# Patient Record
Sex: Female | Born: 1935
Health system: Southern US, Community
[De-identification: ages and names within clinical notes are randomized; demographics above are authoritative.]

## PROBLEM LIST (undated history)

## (undated) DIAGNOSIS — M542 Cervicalgia: Secondary | ICD-10-CM

## (undated) DIAGNOSIS — M5030 Other cervical disc degeneration, unspecified cervical region: Secondary | ICD-10-CM

## (undated) DIAGNOSIS — M51369 Other intervertebral disc degeneration, lumbar region without mention of lumbar back pain or lower extremity pain: Secondary | ICD-10-CM

## (undated) DIAGNOSIS — M5410 Radiculopathy, site unspecified: Secondary | ICD-10-CM

## (undated) DIAGNOSIS — M549 Dorsalgia, unspecified: Secondary | ICD-10-CM

## (undated) DIAGNOSIS — M5136 Other intervertebral disc degeneration, lumbar region: Secondary | ICD-10-CM

## (undated) DIAGNOSIS — E785 Hyperlipidemia, unspecified: Secondary | ICD-10-CM

## (undated) DIAGNOSIS — I219 Acute myocardial infarction, unspecified: Secondary | ICD-10-CM

## (undated) DIAGNOSIS — I2510 Atherosclerotic heart disease of native coronary artery without angina pectoris: Secondary | ICD-10-CM

## (undated) DIAGNOSIS — Z950 Presence of cardiac pacemaker: Secondary | ICD-10-CM

## (undated) DIAGNOSIS — M5430 Sciatica, unspecified side: Secondary | ICD-10-CM

## (undated) DIAGNOSIS — G8929 Other chronic pain: Secondary | ICD-10-CM

## (undated) DIAGNOSIS — I10 Essential (primary) hypertension: Secondary | ICD-10-CM

## (undated) HISTORY — DX: Sciatica, unspecified side: M54.30

## (undated) HISTORY — PX: CATARACT EXTRACTION: SUR2

## (undated) HISTORY — DX: Presence of cardiac pacemaker: Z95.0

---
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
## 1999-02-27 ENCOUNTER — Ambulatory Visit (HOSPITAL_COMMUNITY): Admission: RE | Admit: 1999-02-27 | Discharge: 1999-02-27 | Payer: Self-pay | Admitting: *Deleted

## 2000-01-14 ENCOUNTER — Other Ambulatory Visit: Admission: RE | Admit: 2000-01-14 | Discharge: 2000-01-14 | Payer: Self-pay | Admitting: Gastroenterology

## 2002-01-26 ENCOUNTER — Other Ambulatory Visit: Admission: RE | Admit: 2002-01-26 | Discharge: 2002-01-26 | Payer: Self-pay | Admitting: Internal Medicine

## 2002-03-04 ENCOUNTER — Encounter: Payer: Self-pay | Admitting: Internal Medicine

## 2002-03-04 ENCOUNTER — Encounter: Admission: RE | Admit: 2002-03-04 | Discharge: 2002-03-04 | Payer: Self-pay | Admitting: Internal Medicine

## 2002-07-15 HISTORY — PX: BACK SURGERY: SHX140

## 2003-06-24 ENCOUNTER — Encounter: Admission: RE | Admit: 2003-06-24 | Discharge: 2003-06-24 | Payer: Self-pay | Admitting: Internal Medicine

## 2003-07-06 ENCOUNTER — Inpatient Hospital Stay (HOSPITAL_COMMUNITY): Admission: RE | Admit: 2003-07-06 | Discharge: 2003-07-12 | Payer: Self-pay | Admitting: Neurosurgery

## 2004-11-19 ENCOUNTER — Emergency Department (HOSPITAL_COMMUNITY): Admission: EM | Admit: 2004-11-19 | Discharge: 2004-11-19 | Payer: Self-pay | Admitting: Emergency Medicine

## 2006-09-01 ENCOUNTER — Encounter: Admission: RE | Admit: 2006-09-01 | Discharge: 2006-09-01 | Payer: Self-pay | Admitting: Internal Medicine

## 2006-10-06 ENCOUNTER — Emergency Department (HOSPITAL_COMMUNITY): Admission: EM | Admit: 2006-10-06 | Discharge: 2006-10-06 | Payer: Self-pay | Admitting: Emergency Medicine

## 2007-03-10 ENCOUNTER — Encounter: Admission: RE | Admit: 2007-03-10 | Discharge: 2007-03-10 | Payer: Self-pay | Admitting: Internal Medicine

## 2007-03-15 ENCOUNTER — Encounter: Admission: RE | Admit: 2007-03-15 | Discharge: 2007-03-15 | Payer: Self-pay | Admitting: Internal Medicine

## 2007-06-01 ENCOUNTER — Encounter: Admission: RE | Admit: 2007-06-01 | Discharge: 2007-08-07 | Payer: Self-pay | Admitting: Internal Medicine

## 2007-09-04 ENCOUNTER — Encounter: Admission: RE | Admit: 2007-09-04 | Discharge: 2007-09-04 | Payer: Self-pay | Admitting: Internal Medicine

## 2008-09-05 ENCOUNTER — Encounter: Admission: RE | Admit: 2008-09-05 | Discharge: 2008-09-05 | Payer: Self-pay | Admitting: Internal Medicine

## 2009-03-04 ENCOUNTER — Emergency Department (HOSPITAL_COMMUNITY): Admission: EM | Admit: 2009-03-04 | Discharge: 2009-03-05 | Payer: Self-pay | Admitting: Emergency Medicine

## 2009-05-15 ENCOUNTER — Emergency Department (HOSPITAL_COMMUNITY): Admission: EM | Admit: 2009-05-15 | Discharge: 2009-05-15 | Payer: Self-pay | Admitting: Emergency Medicine

## 2009-07-15 ENCOUNTER — Ambulatory Visit: Payer: Self-pay | Admitting: Internal Medicine

## 2009-07-15 ENCOUNTER — Encounter: Payer: Self-pay | Admitting: Emergency Medicine

## 2009-07-15 ENCOUNTER — Inpatient Hospital Stay (HOSPITAL_COMMUNITY): Admission: EM | Admit: 2009-07-15 | Discharge: 2009-07-18 | Payer: Self-pay | Admitting: Cardiovascular Disease

## 2009-07-16 ENCOUNTER — Encounter (INDEPENDENT_AMBULATORY_CARE_PROVIDER_SITE_OTHER): Payer: Self-pay | Admitting: Cardiovascular Disease

## 2009-07-17 DIAGNOSIS — Z950 Presence of cardiac pacemaker: Secondary | ICD-10-CM

## 2009-07-17 HISTORY — DX: Presence of cardiac pacemaker: Z95.0

## 2009-07-17 HISTORY — PX: PACEMAKER INSERTION: SHX728

## 2009-08-23 ENCOUNTER — Ambulatory Visit (HOSPITAL_COMMUNITY): Admission: RE | Admit: 2009-08-23 | Discharge: 2009-08-23 | Payer: Self-pay | Admitting: Internal Medicine

## 2009-08-31 ENCOUNTER — Encounter: Admission: RE | Admit: 2009-08-31 | Discharge: 2009-08-31 | Payer: Self-pay | Admitting: Internal Medicine

## 2009-09-06 HISTORY — PX: NM MYOCAR PERF WALL MOTION: HXRAD629

## 2009-09-07 ENCOUNTER — Ambulatory Visit (HOSPITAL_COMMUNITY): Admission: RE | Admit: 2009-09-07 | Discharge: 2009-09-07 | Payer: Self-pay | Admitting: Internal Medicine

## 2009-10-26 ENCOUNTER — Encounter (INDEPENDENT_AMBULATORY_CARE_PROVIDER_SITE_OTHER): Payer: Self-pay | Admitting: *Deleted

## 2009-10-30 ENCOUNTER — Telehealth (INDEPENDENT_AMBULATORY_CARE_PROVIDER_SITE_OTHER): Payer: Self-pay | Admitting: *Deleted

## 2009-11-08 ENCOUNTER — Ambulatory Visit (HOSPITAL_COMMUNITY): Admission: RE | Admit: 2009-11-08 | Discharge: 2009-11-08 | Payer: Self-pay | Admitting: Endocrinology

## 2010-05-30 ENCOUNTER — Encounter: Admission: RE | Admit: 2010-05-30 | Discharge: 2010-05-30 | Payer: Self-pay | Admitting: Internal Medicine

## 2010-08-05 ENCOUNTER — Encounter: Payer: Self-pay | Admitting: Internal Medicine

## 2010-08-14 NOTE — Letter (Signed)
Summary: Device-Delinquent Check  [facility], Main Office  [address]   [place], [place] [address]   Phone: [phone]  Fax: [phone]     October 26, 2009 MRN: [id number]   [facility] [place] [address] [place], [place]  [address]   Dear Ms. Jezek,  According to our records, you have not had your implanted device checked in the recommended period of time.  We are unable to determine appropriate device function without checking your device on a regular basis.  Please call our office to schedule an appointment as soon as possible.  If you are having your device checked by another physician, please call us so that we may update our records.  Thank you, Altha Harm, LPN  October 26, 2009 3:57 PM   [facility]

## 2010-08-14 NOTE — Progress Notes (Signed)
Summary: letter / calling back   Phone Note Call from Patient Call back at Home Phone [phone]   Caller: Patient Reason for Call: Talk to Nurse Summary of Call: pt received letter, not sure whats going on, request call back Initial call taken by: Migdalia Dk,  October 30, 2009 8:59 AM  Follow-up for Phone Call        per pt calling back regarding letter. [phone] Lorne Skeens  October 30, 2009 12:26 PM   Additional Follow-up for Phone Call Additional follow up Details #1::        The patient's device is being followed by [facility], Dr. Tresa Endo. Additional Follow-up by: Altha Harm, LPN,  October 31, 2009 8:31 AM

## 2010-08-15 ENCOUNTER — Other Ambulatory Visit: Payer: Self-pay | Admitting: Internal Medicine

## 2010-08-15 DIAGNOSIS — Z1239 Encounter for other screening for malignant neoplasm of breast: Secondary | ICD-10-CM

## 2010-09-04 ENCOUNTER — Ambulatory Visit
Admission: RE | Admit: 2010-09-04 | Discharge: 2010-09-04 | Disposition: A | Discharge status: Home / Self Care | Payer: BC Managed Care – PPO | Source: Ambulatory Visit | Attending: Internal Medicine | Admitting: Internal Medicine

## 2010-09-04 DIAGNOSIS — Z1239 Encounter for other screening for malignant neoplasm of breast: Secondary | ICD-10-CM

## 2010-09-23 ENCOUNTER — Emergency Department (HOSPITAL_COMMUNITY)
Admission: EM | Admit: 2010-09-23 | Discharge: 2010-09-23 | Disposition: A | Discharge status: Home / Self Care | Payer: Medicare Other | Attending: Emergency Medicine | Admitting: Emergency Medicine

## 2010-09-23 DIAGNOSIS — R109 Unspecified abdominal pain: Secondary | ICD-10-CM | POA: Insufficient documentation

## 2010-09-23 DIAGNOSIS — K5289 Other specified noninfective gastroenteritis and colitis: Secondary | ICD-10-CM | POA: Insufficient documentation

## 2010-09-23 DIAGNOSIS — E789 Disorder of lipoprotein metabolism, unspecified: Secondary | ICD-10-CM | POA: Insufficient documentation

## 2010-09-23 DIAGNOSIS — Z950 Presence of cardiac pacemaker: Secondary | ICD-10-CM | POA: Insufficient documentation

## 2010-09-23 DIAGNOSIS — R112 Nausea with vomiting, unspecified: Secondary | ICD-10-CM | POA: Insufficient documentation

## 2010-09-23 DIAGNOSIS — Z79899 Other long term (current) drug therapy: Secondary | ICD-10-CM | POA: Insufficient documentation

## 2010-09-23 DIAGNOSIS — I10 Essential (primary) hypertension: Secondary | ICD-10-CM | POA: Insufficient documentation

## 2010-09-23 DIAGNOSIS — R197 Diarrhea, unspecified: Secondary | ICD-10-CM | POA: Insufficient documentation

## 2010-09-23 LAB — BASIC METABOLIC PANEL
BUN: 20 mg/dL (ref 6–23)
CO2: 29 mEq/L (ref 19–32)
Calcium: 9.7 mg/dL (ref 8.4–10.5)
Chloride: 104 mEq/L (ref 96–112)
Creatinine, Ser: 0.92 mg/dL (ref 0.4–1.2)
GFR calc Af Amer: >60 mL/min (ref >60)
GFR calc non Af Amer: 58 mL/min — ABNORMAL LOW (ref >60)
Glucose, Bld: 115 mg/dL — ABNORMAL HIGH (ref 70–99)
Potassium: 4 mEq/L (ref 3.5–5.1)
Sodium: 143 mEq/L (ref 135–145)

## 2010-09-23 LAB — DIFFERENTIAL
Basophils Absolute: 0 10*3/uL (ref 0.0–0.1)
Basophils Relative: 0 % (ref 0–1)
Eosinophils Absolute: 0.1 10*3/uL (ref 0.0–0.7)
Eosinophils Relative: 0 % (ref 0–5)
Lymphocytes Relative: 6 % — ABNORMAL LOW (ref 12–46)
Lymphs Abs: 0.7 10*3/uL (ref 0.7–4.0)
Monocytes Absolute: 0.6 10*3/uL (ref 0.1–1.0)
Monocytes Relative: 5 % (ref 3–12)
Neutro Abs: 10.3 10*3/uL — ABNORMAL HIGH (ref 1.7–7.7)
Neutrophils Relative %: 88 % — ABNORMAL HIGH (ref 43–77)

## 2010-09-23 LAB — CBC
HCT: 43.6 % (ref 36.0–46.0)
Hemoglobin: 14.9 g/dL (ref 12.0–15.0)
MCH: 31.6 pg (ref 26.0–34.0)
MCHC: 34.2 g/dL (ref 30.0–36.0)
MCV: 92.4 fL (ref 78.0–100.0)
Platelets: 224 10*3/uL (ref 150–400)
RBC: 4.72 MIL/uL (ref 3.87–5.11)
RDW: 13.7 % (ref 11.5–15.5)
WBC: 11.7 10*3/uL — ABNORMAL HIGH (ref 4.0–10.5)

## 2010-09-30 LAB — CBC
HCT: 43.9 % (ref 36.0–46.0)
Hemoglobin: 13.4 g/dL (ref 12.0–15.0)
Hemoglobin: 14.5 g/dL (ref 12.0–15.0)
MCHC: 33.9 g/dL (ref 30.0–36.0)
MCHC: 34.3 g/dL (ref 30.0–36.0)
MCV: 92.4 fL (ref 78.0–100.0)
RBC: 4.22 MIL/uL (ref 3.87–5.11)
RBC: 4.78 MIL/uL (ref 3.87–5.11)
RDW: 13.4 % (ref 11.5–15.5)
RDW: 13.6 % (ref 11.5–15.5)
WBC: 5.5 10*3/uL (ref 4.0–10.5)

## 2010-09-30 LAB — POCT I-STAT, CHEM 8
BUN: 13 mg/dL (ref 6–23)
Calcium, Ion: 1.17 mmol/L (ref 1.12–1.32)
Chloride: 105 mEq/L (ref 96–112)
Creatinine, Ser: 0.9 mg/dL (ref 0.4–1.2)
Glucose, Bld: 134 mg/dL — ABNORMAL HIGH (ref 70–99)
TCO2: 28 mmol/L (ref 0–100)

## 2010-09-30 LAB — CK TOTAL AND CKMB (NOT AT ARMC)
CK, MB: 2 ng/mL (ref 0.3–4.0)
Relative Index: INVALID (ref 0.0–2.5)
Relative Index: INVALID (ref 0.0–2.5)
Total CK: 56 U/L (ref 7–177)
Total CK: 61 U/L (ref 7–177)
Total CK: 79 U/L (ref 7–177)

## 2010-09-30 LAB — BASIC METABOLIC PANEL
BUN: 6 mg/dL (ref 6–23)
CO2: 22 mEq/L (ref 19–32)
CO2: 24 mEq/L (ref 19–32)
Calcium: 8.7 mg/dL (ref 8.4–10.5)
Calcium: 9.1 mg/dL (ref 8.4–10.5)
Chloride: 114 mEq/L — ABNORMAL HIGH (ref 96–112)
Creatinine, Ser: 0.64 mg/dL (ref 0.4–1.2)
GFR calc Af Amer: >60 mL/min (ref >60)
Glucose, Bld: 96 mg/dL (ref 70–99)
Sodium: 144 mEq/L (ref 135–145)

## 2010-09-30 LAB — DIFFERENTIAL
Basophils Absolute: 0 10*3/uL (ref 0.0–0.1)
Eosinophils Relative: 1 % (ref 0–5)
Lymphocytes Relative: 20 % (ref 12–46)
Lymphs Abs: 1.5 10*3/uL (ref 0.7–4.0)
Monocytes Absolute: 0.7 10*3/uL (ref 0.1–1.0)
Monocytes Relative: 10 % (ref 3–12)
Neutro Abs: 4.9 10*3/uL (ref 1.7–7.7)

## 2010-09-30 LAB — HEPATIC FUNCTION PANEL
Albumin: 3.4 g/dL — ABNORMAL LOW (ref 3.5–5.2)
Alkaline Phosphatase: 53 U/L (ref 39–117)
Total Protein: 6.9 g/dL (ref 6.0–8.3)

## 2010-09-30 LAB — URINALYSIS, ROUTINE W REFLEX MICROSCOPIC
Bilirubin Urine: NEGATIVE
Glucose, UA: NEGATIVE mg/dL
Hgb urine dipstick: NEGATIVE
Ketones, ur: NEGATIVE mg/dL
Nitrite: NEGATIVE
pH: 7.5 (ref 5.0–8.0)

## 2010-09-30 LAB — MAGNESIUM: Magnesium: 2.2 mg/dL (ref 1.5–2.5)

## 2010-09-30 LAB — BRAIN NATRIURETIC PEPTIDE: Pro B Natriuretic peptide (BNP): 398 pg/mL — ABNORMAL HIGH (ref 0.0–100.0)

## 2010-09-30 LAB — T3 UPTAKE: T3 Uptake Ratio: 39.6 % — ABNORMAL HIGH (ref 22.5–37.0)

## 2010-09-30 LAB — TROPONIN I: Troponin I: 0.02 ng/mL (ref 0.00–0.06)

## 2010-09-30 LAB — URINE CULTURE
Colony Count: NO GROWTH
Culture: NO GROWTH

## 2010-09-30 LAB — POCT CARDIAC MARKERS
CKMB, poc: 1.3 ng/mL (ref 1.0–8.0)
Myoglobin, poc: 126 ng/mL (ref 12–200)

## 2010-09-30 LAB — PROTIME-INR: INR: 1.18 (ref 0.00–1.49)

## 2010-09-30 LAB — MRSA PCR SCREENING: MRSA by PCR: NEGATIVE

## 2010-09-30 LAB — APTT: aPTT: 33 seconds (ref 24–37)

## 2010-11-30 NOTE — Op Note (Signed)
Krista Ray, Krista Ray                       ACCOUNT NO.:  [id number]   MEDICAL RECORD NO.:  [id number]                   PATIENT TYPE:  INP   LOCATION:  3172                                 FACILITY:  [facility]   PHYSICIAN:  Hewitt Shorts, M.D.            DATE OF BIRTH:  08/03/1923   DATE OF PROCEDURE:  07/06/2003  DATE OF DISCHARGE:                                 OPERATIVE REPORT   PREOPERATIVE DIAGNOSIS:  Right L5-S1 lumbar disk herniation.   POSTOPERATIVE DIAGNOSIS:  Right L5-S1 lumbar disk herniation.   OPERATION PERFORMED:  Right L5 hemilaminectomy and microdiskectomy with  microdissection.   SURGEON:  Hewitt Shorts, M.D.   ASSISTANT:  Hilda Lias, M.D.   ANESTHESIA:  General endotracheal.   INDICATIONS FOR PROCEDURE:  The patient is a 77 year old woman who presented  with a right lumbar radiculopathy and was found to have severe stenosis at  L3-4, marked stenosis at L4-5 but a notable L5-S1 lumbar disk herniation.  Decision was made to proceed with decompression and diskectomy.   DESCRIPTION OF PROCEDURE:  The patient was brought to the operating room and  placed under general endotracheal anesthesia.  The patient was turned to a  prone position. The lumbar region was prepped with DuraPrep and draped in a  sterile fashion.  The midline was infiltrated with local anesthetic with  epinephrine.  An x-ray was taken and the L5-S1 level was identified.  The  midline incision was made, carried down through the subcutaneous tissue with  bipolar cautery and electrocautery was used to maintain hemostasis.  Dissection was carried down to the lumbar fascia which was incised on the  right side of the midline and the paraspinal muscles were dissected from the  spinous processes and lamina in subperiosteal fashion.  X-ray was taken and  the L5-S1 interlaminar space was identified.  We initially proceeded with a  right L5-S1 laminotomy but because of the stenosis, we  extended the  laminotomy to a right L5 hemilaminectomy.  The ligamentum flavum was  carefully resected.  The dura was quite thin and a rent in the dura did  occur as decompression was performed.  However, this was repaired with a  number of interrupted 6-0 Prolene sutures.  We then were able to gradually  mobilize the right S1 nerve root along with the thecal sac medially.  The  disk herniation which was a free fragment was partly adherent to the dura.  We were able to free that up and remove the free fragment and then enter  into the disk space and proceed with diskectomy removing loose degenerated  disk material.  In the end, all fragments of disk material were removed from  both the disk space and the epidural space and good decompression of the  thecal sac and nerve root was achieved.  In the area where the dural rent  had been repaired, a thin layer of muscle tissue was  draped over this repair  and then bioglue was injected over that.  The wound was then closed in  multiple layers.  The deep fascia closed with interrupted undyed 1 Vicryl  suture, the subcutaneous and subcuticular layer closed with interrupted  inverted 2-0 and 3-0 undyed Vicryl sutures, the skin edges were approximated  with Dermabond.  The patient tolerated the procedure  well.  The estimated blood loss was 100 mL.  Sponge and instrument counts  were correct.  Following surgery, the patient was turned back to supine  position to be reversed from anesthetic, extubated and transferred to  recovery room for further care.                                               Hewitt Shorts, M.D.    RWN/MEDQ  D:  07/06/2003  T:  07/07/2003  Job:  [id number]

## 2010-11-30 NOTE — H&P (Signed)
Krista Ray, Krista Ray                       ACCOUNT NO.:  [id number]   MEDICAL RECORD NO.:  [id number]                   PATIENT TYPE:  INP   LOCATION:  3009                                 FACILITY:  [facility]   PHYSICIAN:  Hewitt Shorts, M.D.            DATE OF BIRTH:  08/03/1923   DATE OF ADMISSION:  07/06/2003  DATE OF DISCHARGE:                                HISTORY & PHYSICAL   HISTORY OF PRESENT ILLNESS:  The patient is a 77 year old right-handed black  female who is evaluated for an acute right lumbar radiculopathy.  Her  symptoms began about two months ago without any particular cause.  She  typically will take a walk every morning, but about two months ago, without  any particular cause, she developed pain in the right buttocks that  extending down to the posterior aspect of the right thigh and leg, and  inferolateral aspect of the right foot.  She has had numbness in the right  small toe.  She denies any low back pain, weakness or paresthesias.  She was  treated with Medrol Dosepak without relief.  An MRI was obtained for further  evaluation.  This study showed evidence of multilevel degenerative joint  disease with spondylosis, grade I degenerative spondylolisthesis, facet  arthropathy at both the L2-L3 and L3-L4.  There is moderate multifactorial  spinal stenosis at both the L3-L4 and L4-L5, but the most significant  finding was her right L5-S1 lumbar disk herniation.  The patient is admitted  now for treatment of such.   PAST MEDICAL HISTORY:  She has a history of hypertension treated with  hydrochlorothiazide.  No history of myocardial infarction, history of  diabetes, peptic ulcer disease or lung disease.   ALLERGIES:  PENICILLIN causes hives.   CURRENT MEDICATIONS:  1. Zoloft 50 mg q.a.m.  2. Hydrochlorothiazide 25 mg q.a.m.   PREVIOUS SURGERIES:  Hysterectomy, appendectomy and right-eye cataract  surgery.   FAMILY HISTORY:  Her parents have passed  on.   SOCIAL HISTORY:  The patient is widowed.  She has a son who lives in  [place] area. She does not smoke or drink alcoholic beverages or have a  history of substance abuse.   REVIEW OF SYSTEMS:  Notable for those described in her history of present  illness and past medical history, otherwise unremarkable.   PHYSICAL EXAMINATION:  GENERAL:  The patient is a well-developed, well-  nourished black female in obvious discomfort.  She is tilted so as to avoid  bearing any pressure on the right buttocks and thigh.  She uses a cane to  give her some support as she walks.  VITAL SIGNS:  Height 5 feet, 1 inch.  Weight is 145 pounds.  She is  afebrile.  LUNGS:  Clear to auscultation.  She has symmetrical respiratory excursion.  HEART:  Regular rate and rhythm, S1 and S2.  There is no murmur.  ABDOMEN:  Soft, nondistended.  Bowel sounds are present.  EXTREMITIES:  Examination shows no clubbing, cyanosis or edema.  MUSCULOSKELETAL:  Examination showed no tenderness to palpation over the  lumbar spinous process, or primary musculature.  However, the mobility of  her lumbar region is limited in flexion and extension due to pain, and she  has positive straight leg raising on the right, negative straight leg  raising on the left.  NEUROLOGIC:  Examination shows 5/5 strength in the dorsiflexion, plantar  flexion and extensor hallucis longus bilaterally.  Sensation is diminished  to pinprick in the lateral aspect of the right foot.  Reflexes are 1 and 2  at the quadriceps and left gastrocnemius.  However, the right gastrocnemius  is absent.  Toes are downgoing bilaterally.  Her gait and stance both favor  the right lower extremity.   IMPRESSION:  Acute right S1 radiculopathy secondary to a right L5-S1 lumbar  disk herniation.  The patient has degenerative disease with spondylosis,  with moderate stenosis at both L3-L4 and L4-L5 and degenerative listhesis at  both L2-L3 and L3-L4.   PLAN:   The patient will be admitted for right L5-S1 lumbar microdiscectomy.  We discussed the nature of the surgery, using MRI scan along with a myelo  __________ .  Also discussed the surgery, hospitalization for recuperation,  her limitations postoperatively and risk of infection, bleeding, possible  need for transfusion, the risk of nerve dysfunction, pain, weakness,  numbness or paresthesia, the risk of dural tear and CSF leaks, possible need  for further surgery, risk of recurrent disk herniation, possible need for  further surgery and anesthetic risks,  myocardial infarction, stroke,  culminating in death.  I discussed all of this.  She does wish to go ahead  for surgery and is admitted for such.                                                Hewitt Shorts, M.D.    RWN/MEDQ  D:  07/08/2003  T:  07/10/2003  Job:  [id number]

## 2010-11-30 NOTE — Discharge Summary (Signed)
Krista Ray, Krista Ray                       ACCOUNT NO.:  [id number]   MEDICAL RECORD NO.:  [id number]                   PATIENT TYPE:  INP   LOCATION:  3009                                 FACILITY:  [facility]   PHYSICIAN:  Hewitt Shorts, M.D.            DATE OF BIRTH:  08/03/1923   DATE OF ADMISSION:  07/06/2003  DATE OF DISCHARGE:  07/12/2003                                 DISCHARGE SUMMARY   HISTORY:  This is a 77 year old woman who presented with a right lumbar  radiculopathy.  MRI showed multilevel degenerative changes with moderate  stenosis at the L3,4 and L4,5 levels, but the most significant finding was a  right L5/S1 dyskinesia.  The patient was admitted for diskectomy.   GENERAL EXAMINATION:  Unremarkable.   NEUROLOGIC EXAMINATION:  Showed 5/5 strength.   HOSPITAL COURSE:  The patient was admitted, underwent a right L5  hemilaminectomy and microdiskectomy with microdissection.  At the time of  surgery, the stenosis was found to be quite marked and the dura thinned and  a rent in the dura occurred as the decompression was performed.  This was  repaired with Prolene sutures.  Following surgery, the patient was kept on  bed rest for a total of two days and then gradually mobilized.  She did  fairly well initially but had some unsteadiness and lightheadedness when  ambulating.  This has gradually improved.  She has also had some discomfort  in the right hip and this has also gradually improved.   At this time, the patient is being discharged to home.  She is going to be  assisted in her care by her niece.  She has been given a prescription for  Vicodin 1 tablet q.i.d. p.r.n. pain, 20 tablets prescribed.  No refills.   DISCHARGE DIAGNOSES:  1. Lumbar dyskinesia.  2. Lumbar stenosis.  3. Lumbar spondylosis.  4. Lumbar degenerative disk disease.   DISCHARGE FOLLOWUP:  She is to return to my office in three weeks for  followup.  No x-rays are necessary.                                           Hewitt Shorts, M.D.    RWN/MEDQ  D:  07/11/2003  T:  07/11/2003  Job:  [id number]

## 2011-05-21 ENCOUNTER — Other Ambulatory Visit: Payer: Self-pay

## 2011-05-21 ENCOUNTER — Encounter: Payer: Self-pay | Admitting: *Deleted

## 2011-05-21 ENCOUNTER — Emergency Department (HOSPITAL_COMMUNITY): Payer: Medicare Other

## 2011-05-21 ENCOUNTER — Emergency Department (HOSPITAL_COMMUNITY)
Admission: EM | Admit: 2011-05-21 | Discharge: 2011-05-21 | Disposition: A | Discharge status: Home / Self Care | Payer: Medicare Other | Attending: Emergency Medicine | Admitting: Emergency Medicine

## 2011-05-21 DIAGNOSIS — Z45018 Encounter for adjustment and management of other part of cardiac pacemaker: Secondary | ICD-10-CM

## 2011-05-21 DIAGNOSIS — M25519 Pain in unspecified shoulder: Secondary | ICD-10-CM | POA: Insufficient documentation

## 2011-05-21 DIAGNOSIS — M546 Pain in thoracic spine: Secondary | ICD-10-CM | POA: Insufficient documentation

## 2011-05-21 DIAGNOSIS — R079 Chest pain, unspecified: Secondary | ICD-10-CM | POA: Insufficient documentation

## 2011-05-21 DIAGNOSIS — I252 Old myocardial infarction: Secondary | ICD-10-CM | POA: Insufficient documentation

## 2011-05-21 DIAGNOSIS — I2510 Atherosclerotic heart disease of native coronary artery without angina pectoris: Secondary | ICD-10-CM | POA: Insufficient documentation

## 2011-05-21 DIAGNOSIS — Z79899 Other long term (current) drug therapy: Secondary | ICD-10-CM | POA: Insufficient documentation

## 2011-05-21 DIAGNOSIS — I10 Essential (primary) hypertension: Secondary | ICD-10-CM | POA: Insufficient documentation

## 2011-05-21 DIAGNOSIS — Z950 Presence of cardiac pacemaker: Secondary | ICD-10-CM | POA: Insufficient documentation

## 2011-05-21 DIAGNOSIS — M79609 Pain in unspecified limb: Secondary | ICD-10-CM | POA: Insufficient documentation

## 2011-05-21 HISTORY — DX: Essential (primary) hypertension: I10

## 2011-05-21 HISTORY — DX: Atherosclerotic heart disease of native coronary artery without angina pectoris: I25.10

## 2011-05-21 HISTORY — DX: Acute myocardial infarction, unspecified: I21.9
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
## 2011-05-21 NOTE — ED Notes (Signed)
[employer] rep states will be here in about to check pacemaker.

## 2011-05-21 NOTE — ED Notes (Signed)
Pt is in X-ray will do ECG when she returns
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
## 2011-05-21 NOTE — ED Notes (Signed)
"  OK to remove from monitor" per Dr. Manus Gunning, pt and family frustrated about wait.  plan, process & wait explained, interrogator here at [facility], arrived now. Alert, NAD, calm.
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
## 2011-07-29 ENCOUNTER — Other Ambulatory Visit: Payer: Self-pay | Admitting: Internal Medicine

## 2011-07-29 DIAGNOSIS — Z1231 Encounter for screening mammogram for malignant neoplasm of breast: Secondary | ICD-10-CM

## 2011-09-06 ENCOUNTER — Ambulatory Visit: Payer: Medicare Other

## 2011-09-17 ENCOUNTER — Ambulatory Visit
Admission: RE | Admit: 2011-09-17 | Discharge: 2011-09-17 | Disposition: A | Discharge status: Home / Self Care | Payer: Medicare Other | Source: Ambulatory Visit | Attending: Internal Medicine | Admitting: Internal Medicine

## 2011-09-17 DIAGNOSIS — Z1231 Encounter for screening mammogram for malignant neoplasm of breast: Secondary | ICD-10-CM | POA: Diagnosis not present

## 2011-10-17 DIAGNOSIS — I10 Essential (primary) hypertension: Secondary | ICD-10-CM | POA: Diagnosis not present

## 2011-10-17 DIAGNOSIS — E782 Mixed hyperlipidemia: Secondary | ICD-10-CM | POA: Diagnosis not present

## 2011-10-24 DIAGNOSIS — M159 Polyosteoarthritis, unspecified: Secondary | ICD-10-CM | POA: Diagnosis not present

## 2011-10-24 DIAGNOSIS — E782 Mixed hyperlipidemia: Secondary | ICD-10-CM | POA: Diagnosis not present

## 2011-10-24 DIAGNOSIS — I10 Essential (primary) hypertension: Secondary | ICD-10-CM | POA: Diagnosis not present

## 2011-10-24 DIAGNOSIS — K5730 Diverticulosis of large intestine without perforation or abscess without bleeding: Secondary | ICD-10-CM | POA: Diagnosis not present

## 2011-11-12 DIAGNOSIS — I442 Atrioventricular block, complete: Secondary | ICD-10-CM | POA: Diagnosis not present

## 2011-11-12 DIAGNOSIS — Z45018 Encounter for adjustment and management of other part of cardiac pacemaker: Secondary | ICD-10-CM | POA: Diagnosis not present

## 2011-11-18 DIAGNOSIS — E782 Mixed hyperlipidemia: Secondary | ICD-10-CM | POA: Diagnosis not present

## 2011-11-18 DIAGNOSIS — R000 Tachycardia, unspecified: Secondary | ICD-10-CM | POA: Diagnosis not present

## 2011-11-18 DIAGNOSIS — I119 Hypertensive heart disease without heart failure: Secondary | ICD-10-CM | POA: Diagnosis not present

## 2011-11-22 DIAGNOSIS — E0520 Thyrotoxicosis with toxic multinodular goiter without thyrotoxic crisis or storm: Secondary | ICD-10-CM | POA: Diagnosis not present

## 2011-11-28 DIAGNOSIS — R000 Tachycardia, unspecified: Secondary | ICD-10-CM | POA: Diagnosis not present

## 2011-11-28 DIAGNOSIS — E782 Mixed hyperlipidemia: Secondary | ICD-10-CM | POA: Diagnosis not present

## 2011-11-28 DIAGNOSIS — I119 Hypertensive heart disease without heart failure: Secondary | ICD-10-CM | POA: Diagnosis not present

## 2011-11-28 HISTORY — PX: US ECHOCARDIOGRAPHY: HXRAD669

## 2011-12-06 DIAGNOSIS — H04129 Dry eye syndrome of unspecified lacrimal gland: Secondary | ICD-10-CM | POA: Diagnosis not present

## 2011-12-06 DIAGNOSIS — H409 Unspecified glaucoma: Secondary | ICD-10-CM | POA: Diagnosis not present

## 2011-12-06 DIAGNOSIS — H4011X0 Primary open-angle glaucoma, stage unspecified: Secondary | ICD-10-CM | POA: Diagnosis not present

## 2011-12-24 ENCOUNTER — Other Ambulatory Visit: Payer: Self-pay | Admitting: Internal Medicine

## 2011-12-24 DIAGNOSIS — M79609 Pain in unspecified limb: Secondary | ICD-10-CM | POA: Diagnosis not present

## 2011-12-24 DIAGNOSIS — M7989 Other specified soft tissue disorders: Secondary | ICD-10-CM

## 2011-12-24 DIAGNOSIS — I10 Essential (primary) hypertension: Secondary | ICD-10-CM | POA: Diagnosis not present

## 2011-12-24 DIAGNOSIS — R609 Edema, unspecified: Secondary | ICD-10-CM | POA: Diagnosis not present

## 2011-12-24 DIAGNOSIS — E782 Mixed hyperlipidemia: Secondary | ICD-10-CM | POA: Diagnosis not present

## 2011-12-25 ENCOUNTER — Inpatient Hospital Stay: Admission: RE | Admit: 2011-12-25 | Payer: Medicare Other | Source: Ambulatory Visit

## 2012-01-01 DIAGNOSIS — R209 Unspecified disturbances of skin sensation: Secondary | ICD-10-CM | POA: Diagnosis not present

## 2012-01-14 ENCOUNTER — Other Ambulatory Visit: Payer: Self-pay | Admitting: Internal Medicine

## 2012-01-14 DIAGNOSIS — R600 Localized edema: Secondary | ICD-10-CM

## 2012-01-14 DIAGNOSIS — R609 Edema, unspecified: Secondary | ICD-10-CM | POA: Diagnosis not present

## 2012-01-14 DIAGNOSIS — M79609 Pain in unspecified limb: Secondary | ICD-10-CM | POA: Diagnosis not present

## 2012-01-22 ENCOUNTER — Inpatient Hospital Stay
Admission: RE | Admit: 2012-01-22 | Discharge: 2012-01-22 | Payer: Medicare Other | Source: Ambulatory Visit | Attending: Internal Medicine | Admitting: Internal Medicine

## 2012-01-22 ENCOUNTER — Ambulatory Visit
Admission: RE | Admit: 2012-01-22 | Discharge: 2012-01-22 | Disposition: A | Discharge status: Home / Self Care | Payer: Medicare Other | Source: Ambulatory Visit | Attending: Internal Medicine | Admitting: Internal Medicine

## 2012-01-22 ENCOUNTER — Other Ambulatory Visit: Payer: Medicare Other

## 2012-01-22 DIAGNOSIS — R600 Localized edema: Secondary | ICD-10-CM

## 2012-01-22 DIAGNOSIS — R609 Edema, unspecified: Secondary | ICD-10-CM | POA: Diagnosis not present

## 2012-01-22 DIAGNOSIS — M7989 Other specified soft tissue disorders: Secondary | ICD-10-CM | POA: Diagnosis not present

## 2012-02-07 ENCOUNTER — Other Ambulatory Visit: Payer: Self-pay | Admitting: Internal Medicine

## 2012-02-07 DIAGNOSIS — M5412 Radiculopathy, cervical region: Secondary | ICD-10-CM

## 2012-02-14 ENCOUNTER — Ambulatory Visit
Admission: RE | Admit: 2012-02-14 | Discharge: 2012-02-14 | Disposition: A | Discharge status: Home / Self Care | Payer: Medicare Other | Source: Ambulatory Visit | Attending: Internal Medicine | Admitting: Internal Medicine

## 2012-02-14 DIAGNOSIS — M5412 Radiculopathy, cervical region: Secondary | ICD-10-CM

## 2012-02-14 DIAGNOSIS — M5020 Other cervical disc displacement, unspecified cervical region: Secondary | ICD-10-CM | POA: Diagnosis not present

## 2012-02-14 DIAGNOSIS — M47812 Spondylosis without myelopathy or radiculopathy, cervical region: Secondary | ICD-10-CM | POA: Diagnosis not present

## 2012-02-14 DIAGNOSIS — M25519 Pain in unspecified shoulder: Secondary | ICD-10-CM | POA: Diagnosis not present

## 2012-02-20 DIAGNOSIS — K5730 Diverticulosis of large intestine without perforation or abscess without bleeding: Secondary | ICD-10-CM | POA: Diagnosis not present

## 2012-02-20 DIAGNOSIS — I10 Essential (primary) hypertension: Secondary | ICD-10-CM | POA: Diagnosis not present

## 2012-02-20 DIAGNOSIS — E782 Mixed hyperlipidemia: Secondary | ICD-10-CM | POA: Diagnosis not present

## 2012-02-27 DIAGNOSIS — K5730 Diverticulosis of large intestine without perforation or abscess without bleeding: Secondary | ICD-10-CM | POA: Diagnosis not present

## 2012-02-27 DIAGNOSIS — R609 Edema, unspecified: Secondary | ICD-10-CM | POA: Diagnosis not present

## 2012-02-27 DIAGNOSIS — I10 Essential (primary) hypertension: Secondary | ICD-10-CM | POA: Diagnosis not present

## 2012-02-27 DIAGNOSIS — E782 Mixed hyperlipidemia: Secondary | ICD-10-CM | POA: Diagnosis not present

## 2012-03-09 DIAGNOSIS — I495 Sick sinus syndrome: Secondary | ICD-10-CM | POA: Diagnosis not present

## 2012-03-18 DIAGNOSIS — Z23 Encounter for immunization: Secondary | ICD-10-CM | POA: Diagnosis not present

## 2012-03-25 DIAGNOSIS — M5412 Radiculopathy, cervical region: Secondary | ICD-10-CM | POA: Diagnosis not present

## 2012-04-09 DIAGNOSIS — L219 Seborrheic dermatitis, unspecified: Secondary | ICD-10-CM | POA: Diagnosis not present

## 2012-04-09 DIAGNOSIS — L658 Other specified nonscarring hair loss: Secondary | ICD-10-CM | POA: Diagnosis not present

## 2012-05-06 DIAGNOSIS — M4310 Spondylolisthesis, site unspecified: Secondary | ICD-10-CM | POA: Diagnosis not present

## 2012-05-06 DIAGNOSIS — M48061 Spinal stenosis, lumbar region without neurogenic claudication: Secondary | ICD-10-CM | POA: Diagnosis not present

## 2012-05-19 ENCOUNTER — Other Ambulatory Visit: Payer: Self-pay | Admitting: Orthopedic Surgery

## 2012-05-19 DIAGNOSIS — M545 Low back pain: Secondary | ICD-10-CM

## 2012-05-21 ENCOUNTER — Other Ambulatory Visit: Payer: Medicare Other

## 2012-05-25 ENCOUNTER — Ambulatory Visit
Admission: RE | Admit: 2012-05-25 | Discharge: 2012-05-25 | Disposition: A | Discharge status: Home / Self Care | Payer: Medicare Other | Source: Ambulatory Visit | Attending: Orthopedic Surgery | Admitting: Orthopedic Surgery

## 2012-05-25 DIAGNOSIS — M48061 Spinal stenosis, lumbar region without neurogenic claudication: Secondary | ICD-10-CM | POA: Diagnosis not present

## 2012-05-25 DIAGNOSIS — M545 Low back pain: Secondary | ICD-10-CM

## 2012-05-25 DIAGNOSIS — M5137 Other intervertebral disc degeneration, lumbosacral region: Secondary | ICD-10-CM | POA: Diagnosis not present

## 2012-05-29 ENCOUNTER — Encounter (HOSPITAL_COMMUNITY): Payer: Self-pay

## 2012-05-29 ENCOUNTER — Emergency Department (INDEPENDENT_AMBULATORY_CARE_PROVIDER_SITE_OTHER)
Admission: EM | Admit: 2012-05-29 | Discharge: 2012-05-29 | Disposition: A | Discharge status: Home / Self Care | Payer: Medicare Other | Source: Home / Self Care | Attending: Family Medicine | Admitting: Family Medicine

## 2012-05-29 DIAGNOSIS — M5000 Cervical disc disorder with myelopathy, unspecified cervical region: Secondary | ICD-10-CM | POA: Diagnosis not present

## 2012-05-29 MED ORDER — HYDROCODONE-ACETAMINOPHEN 5-500 MG PO TABS: 1.0000 | ORAL_TABLET | Freq: Four times a day (QID) | ORAL | Status: DC | PRN

## 2012-05-29 MED ORDER — TRIAMCINOLONE ACETONIDE 40 MG/ML IJ SUSP
40.0000 mg | Freq: Once | INTRAMUSCULAR | Status: AC
  Administered 2012-05-29: 40 mg via INTRAMUSCULAR

## 2012-05-29 MED ORDER — TRIAMCINOLONE ACETONIDE 40 MG/ML IJ SUSP
INTRAMUSCULAR | Status: AC
  Filled 2012-05-29: qty 5

## 2012-05-29 MED ORDER — KETOROLAC TROMETHAMINE 30 MG/ML IJ SOLN
INTRAMUSCULAR | Status: AC
  Filled 2012-05-29: qty 1

## 2012-05-29 MED ORDER — KETOROLAC TROMETHAMINE 30 MG/ML IJ SOLN
30.0000 mg | Freq: Once | INTRAMUSCULAR | Status: AC
  Administered 2012-05-29: 30 mg via INTRAMUSCULAR

## 2012-05-29 NOTE — ED Notes (Signed)
C/o right side, neck, shoulder and arm pain, sx started Tuesday she states that she was told previously by PCP that it was a "pinched nerve"

## 2012-05-29 NOTE — ED Provider Notes (Signed)
History     CSN: [id number]  Arrival date & time 05/29/12  1118   First MD Initiated Contact with Patient 05/29/12 1214      Chief Complaint  Patient presents with  . Shoulder Pain    (Consider location/radiation/quality/duration/timing/severity/associated sxs/prior treatment) Patient is a 78 y.o. female presenting with shoulder pain. The history is provided by the patient.  Shoulder Pain This is a new problem. The current episode started more than 2 days ago. The problem has been gradually worsening. Associated symptoms comments: Right arm shooting pains with finger paresthesias. .    Past Medical History  Diagnosis Date  . Pacemaker   . Coronary artery disease   . MI (myocardial infarction)   . Hypertension     Past Surgical History  Procedure Date  . Pacemaker insertion     left side    No family history on file.  History  Substance Use Topics  . Smoking status: Never Smoker   . Smokeless tobacco: Not on file  . Alcohol Use: No    OB History    Grav Para Term Preterm Abortions TAB SAB Ect Mult Living                  Review of Systems  Constitutional: Negative.   HENT: Positive for neck pain.   Musculoskeletal: Negative for gait problem.    Allergies  Penicillins  Home Medications   Current Outpatient Rx  Name  Route  Sig  Dispense  Refill  . AMLODIPINE BESYLATE 5 MG PO TABS   Oral   Take 5 mg by mouth daily.           . ASPIR-81 PO   Oral   Take 162 mg by mouth daily.           Marland Kitchen CALCIUM CARBONATE-VITAMIN D 600-400 MG-UNIT PO TABS   Oral   Take 1 tablet by mouth 2 (two) times daily.           Marland Kitchen EZETIMIBE 10 MG PO TABS   Oral   Take 10 mg by mouth daily.         Marland Kitchen METOPROLOL TARTRATE 25 MG PO TABS   Oral   Take 25 mg by mouth daily.         Carma Leaven M PLUS PO TABS   Oral   Take 1 tablet by mouth daily.           Marland Kitchen VITAMIN D (ERGOCALCIFEROL) 50000 UNITS PO CAPS   Oral   Take 50,000 Units by mouth every 7 (seven)  days. Take on sundays          . HYDROCORTISONE ACETATE 30 MG RE SUPP   Rectal   Place 1 suppository rectally daily as needed.             BP 167/59  Pulse 68  Temp 98.1 F (36.7 C) (Oral)  Resp 20  SpO2 100%  Physical Exam  Nursing note and vitals reviewed. Constitutional: She is oriented to person, place, and time. She appears well-developed and well-nourished.  HENT:  Head: Normocephalic and atraumatic.  Neck: Normal range of motion. Neck supple. Muscular tenderness present. No spinous process tenderness present. No rigidity. Normal range of motion present. No Brudzinski's sign and no Kernig's sign noted.  Musculoskeletal: She exhibits tenderness.       Arms: Neurological: She is alert and oriented to person, place, and time.  Skin: Skin is warm and dry.    ED  Course  Procedures (including critical care time)  Labs Reviewed - No data to display No results found.   1. Disc disease with myelopathy, cervical       MDM  CT SCAN   02/2012  C5-6 R> L FORAMINAL STENOSIS        Linna Hoff, MD 05/29/12 1251

## 2012-06-03 DIAGNOSIS — M47812 Spondylosis without myelopathy or radiculopathy, cervical region: Secondary | ICD-10-CM | POA: Diagnosis not present

## 2012-06-03 DIAGNOSIS — M5412 Radiculopathy, cervical region: Secondary | ICD-10-CM | POA: Diagnosis not present

## 2012-06-03 DIAGNOSIS — M48061 Spinal stenosis, lumbar region without neurogenic claudication: Secondary | ICD-10-CM | POA: Diagnosis not present

## 2012-06-08 DIAGNOSIS — I495 Sick sinus syndrome: Secondary | ICD-10-CM | POA: Diagnosis not present

## 2012-06-08 DIAGNOSIS — R609 Edema, unspecified: Secondary | ICD-10-CM | POA: Diagnosis not present

## 2012-06-16 DIAGNOSIS — E782 Mixed hyperlipidemia: Secondary | ICD-10-CM | POA: Diagnosis not present

## 2012-06-16 DIAGNOSIS — I10 Essential (primary) hypertension: Secondary | ICD-10-CM | POA: Diagnosis not present

## 2012-06-16 DIAGNOSIS — K5730 Diverticulosis of large intestine without perforation or abscess without bleeding: Secondary | ICD-10-CM | POA: Diagnosis not present

## 2012-06-23 DIAGNOSIS — M5412 Radiculopathy, cervical region: Secondary | ICD-10-CM | POA: Diagnosis not present

## 2012-06-25 DIAGNOSIS — E2839 Other primary ovarian failure: Secondary | ICD-10-CM | POA: Diagnosis not present

## 2012-06-25 DIAGNOSIS — Z23 Encounter for immunization: Secondary | ICD-10-CM | POA: Diagnosis not present

## 2012-06-25 DIAGNOSIS — M5412 Radiculopathy, cervical region: Secondary | ICD-10-CM | POA: Diagnosis not present

## 2012-06-25 DIAGNOSIS — E782 Mixed hyperlipidemia: Secondary | ICD-10-CM | POA: Diagnosis not present

## 2012-06-25 DIAGNOSIS — I10 Essential (primary) hypertension: Secondary | ICD-10-CM | POA: Diagnosis not present

## 2012-06-25 DIAGNOSIS — IMO0002 Reserved for concepts with insufficient information to code with codable children: Secondary | ICD-10-CM | POA: Diagnosis not present

## 2012-07-10 ENCOUNTER — Ambulatory Visit: Payer: Medicare Other | Admitting: Family Medicine

## 2012-08-18 ENCOUNTER — Other Ambulatory Visit: Payer: Self-pay | Admitting: Internal Medicine

## 2012-08-18 DIAGNOSIS — Z1231 Encounter for screening mammogram for malignant neoplasm of breast: Secondary | ICD-10-CM

## 2012-09-14 ENCOUNTER — Ambulatory Visit: Payer: Medicare Other | Admitting: Internal Medicine

## 2012-09-17 ENCOUNTER — Ambulatory Visit
Admission: RE | Admit: 2012-09-17 | Discharge: 2012-09-17 | Disposition: A | Discharge status: Home / Self Care | Payer: Medicare Other | Source: Ambulatory Visit | Attending: Internal Medicine | Admitting: Internal Medicine

## 2012-12-17 ENCOUNTER — Ambulatory Visit (INDEPENDENT_AMBULATORY_CARE_PROVIDER_SITE_OTHER): Payer: Medicare Other | Admitting: Cardiovascular Disease

## 2012-12-17 ENCOUNTER — Encounter: Payer: Self-pay | Admitting: Cardiovascular Disease

## 2012-12-17 VITALS — BP 138/72 | HR 68 | Resp 20 | Ht 61.0 in | Wt 151.0 lb

## 2012-12-17 DIAGNOSIS — Z950 Presence of cardiac pacemaker: Secondary | ICD-10-CM

## 2012-12-17 DIAGNOSIS — I10 Essential (primary) hypertension: Secondary | ICD-10-CM

## 2012-12-17 DIAGNOSIS — M47817 Spondylosis without myelopathy or radiculopathy, lumbosacral region: Secondary | ICD-10-CM

## 2012-12-17 DIAGNOSIS — I442 Atrioventricular block, complete: Secondary | ICD-10-CM

## 2012-12-17 DIAGNOSIS — E785 Hyperlipidemia, unspecified: Secondary | ICD-10-CM

## 2012-12-17 DIAGNOSIS — M47816 Spondylosis without myelopathy or radiculopathy, lumbar region: Secondary | ICD-10-CM

## 2012-12-17 LAB — PACEMAKER DEVICE OBSERVATION

## 2012-12-17 NOTE — Progress Notes (Signed)
Patient ID: Krista Ray, female   DOB: 13-Jul-1923, 79 y.o.   MRN: [id number]     Reason for office visit Followup for hypertension, dyslipidemia, third-degree atrial ventricular block and pacemaker    Krista Ray has done well from a cardiovascular standpoint since her last appointment in March. She really has no cardiovascular complaints. Unfortunately she continues to have severe problems with pain in her left lower shunting a pattern highly suggestive of sciatica. She has been seeing a specialist but so far the treatments have not offered relief. She still walks with a cane and has limited her activity because of back problems. Allergies  Allergen Reactions  . Penicillins     Current Outpatient Prescriptions  Medication Sig Dispense Refill  . amLODipine (NORVASC) 5 MG tablet Take 5 mg by mouth daily.        . Aspirin (ASPIR-81 PO) Take 162 mg by mouth daily.        . Calcium Carbonate-Vitamin D (CALTRATE 600+D) 600-400 MG-UNIT per tablet Take 1 tablet by mouth 2 (two) times daily.        Marland Kitchen ezetimibe (ZETIA) 10 MG tablet Take 10 mg by mouth daily.      Marland Kitchen HYDROCORTISONE ACE, RECTAL, 30 MG SUPP Place 1 suppository rectally daily as needed.        . metoprolol succinate (TOPROL-XL) 25 MG 24 hr tablet Take 25 mg by mouth daily.      . Multiple Vitamins-Minerals (MULTIVITAMINS THER. W/MINERALS) TABS Take 1 tablet by mouth daily.        . Vitamin D, Ergocalciferol, (DRISDOL) 50000 UNITS CAPS Take 50,000 Units by mouth every 7 (seven) days. Take on sundays        No current facility-administered medications for this visit.    Past Medical History  Diagnosis Date  . Pacemaker   . Coronary artery disease   . MI (myocardial infarction)   . Hypertension     Past Surgical History  Procedure Laterality Date  . Pacemaker insertion      left side    No family history on file.  History   Social History  . Marital Status: Widowed    Spouse Name: N/A    Number of Children: N/A    . Years of Education: N/A   Occupational History  . Not on file.   Social History Main Topics  . Smoking status: Never Smoker   . Smokeless tobacco: Not on file  . Alcohol Use: No  . Drug Use: No  . Sexually Active:    Other Topics Concern  . Not on file   Social History Narrative  . No narrative on file    Review of systems: The patient specifically denies any chest pain at rest exertion, dyspnea at rest or with exertion, orthopnea, paroxysmal nocturnal dyspnea, syncope, palpitations, focal neurological deficits, intermittent claudication, lower extremity edema, unexplained weight gain, cough, hemoptysis or wheezing.   PHYSICAL EXAM BP 138/72  Pulse 68  Resp 20  Ht 5\' 1"  (1.549 m)  Wt 151 lb (68.493 kg)  BMI 28.55 kg/m2  General: Alert, oriented x3, no distress Head: no evidence of trauma, PERRL, EOMI, no exophtalmos or lid lag, no myxedema, no xanthelasma; normal ears, nose and oropharynx Neck: normal jugular venous pulsations and no hepatojugular reflux; brisk carotid pulses without delay and no carotid bruits Chest: clear to auscultation, no signs of consolidation by percussion or palpation, normal fremitus, symmetrical and full respiratory excursions; healthy left subclavian pacemaker site Cardiovascular: normal  position and quality of the apical impulse, regular rhythm, normal first and second heart sounds, no murmurs, rubs or gallops Abdomen: no tenderness or distention, no masses by palpation, no abnormal pulsatility or arterial bruits, normal bowel sounds, no hepatosplenomegaly Extremities: no clubbing, cyanosis or edema; 2+ radial, ulnar and brachial pulses bilaterally; 2+ right femoral, posterior tibial and dorsalis pedis pulses; 2+ left femoral, posterior tibial and dorsalis pedis pulses; no subclavian or femoral bruits Neurological: grossly nonfocal   EKG: Normal sinus rhythm, nonspecific intraventricular block most closely resembling  atypical right bundle  branch block with left posterior fascicular block  BMET    Component Value Date/Time   NA 143 09/23/2010 0626   K 4.0 09/23/2010 0626   CL 104 09/23/2010 0626   CO2 29 09/23/2010 0626   GLUCOSE 115* 09/23/2010 0626   BUN 20 09/23/2010 0626   CREATININE 0.92 09/23/2010 0626   CALCIUM 9.7 09/23/2010 0626   GFRNONAA 58* 09/23/2010 0626   GFRAA  Value: >60        The eGFR has been calculated using the MDRD equation. This calculation has not been validated in all clinical situations. eGFR's persistently <60 mL/min signify possible Chronic Kidney Disease. 09/23/2010 0626     ASSESSMENT AND PLAN Essential hypertension Adequate control  Hyperlipidemia Most recent lipid profile borderline satisfactory with total cholesterol 202, crit was returned 4, HDL 72, LDL 109. She reports statin intolerance due to myalgia  Pacemaker Normal pacemaker function (please refer to separate note). 6.1% atrial pacing, less than 1% ventricular pacing. Presenting rhythm atrial sensed ventricular sense, estimated battery longevity 9.5 years. No meaningful episodes of atrial or ventricular arrhythmia. Good lead parameters. A followup in device clinic in 3 months. Note nonspecific intraventricular conduction delay with total QRS duration of 142 ms during native AV conduction  DJD (degenerative joint disease) of lumbar spine This is clearly her most limiting and debilitating problem. She has not obtained much relief from injection therapy. She cannot have MRIs of the spine due to her pacemaker. She has previously had lumbar spine surgery which solved sciatica on the right side but is now mostly troubled by left-sided symptoms. No chills also had symptoms suggestive of cervical radicular impingement with pain in her right shoulder and arm. She walks with a cane.  Orders Placed This Encounter  Procedures  . EKG 12-Lead   Meds ordered this encounter  Medications  . metoprolol succinate (TOPROL-XL) 25 MG 24 hr tablet    Sig:  Take 25 mg by mouth daily.    Junious Silk, MD, [facility] [phone] office [phone] pager

## 2012-12-17 NOTE — Assessment & Plan Note (Signed)
This is clearly her most limiting and debilitating problem. She has not obtained much relief from injection therapy. She cannot have MRIs of the spine due to her pacemaker. She has previously had lumbar spine surgery which solved sciatica on the right side but is now mostly troubled by left-sided symptoms. No chills also had symptoms suggestive of cervical radicular impingement with pain in her right shoulder and arm. She walks with a cane.

## 2012-12-17 NOTE — Patient Instructions (Signed)
Your physician recommends that you schedule a follow-up appointment in: September 2014 for pacer clinic, and in 12 months with Dr. Royann Shivers

## 2012-12-17 NOTE — Assessment & Plan Note (Addendum)
Most recent lipid profile borderline satisfactory with total cholesterol 202, crit was returned 4, HDL 72, LDL 109. She reports statin intolerance due to myalgia

## 2012-12-17 NOTE — Progress Notes (Signed)
In clinic pacemaker interrogation. Normal device function. No changes made this session. Will reevaluate in 3 months.

## 2012-12-17 NOTE — Assessment & Plan Note (Signed)
Normal pacemaker function (please refer to separate note). 6.1% atrial pacing, less than 1% ventricular pacing. Presenting rhythm atrial sensed ventricular sense, estimated battery longevity 9.5 years. No meaningful episodes of atrial or ventricular arrhythmia. Good lead parameters. A followup in device clinic in 3 months. Note nonspecific intraventricular conduction delay with total QRS duration of 142 ms during native AV conduction

## 2012-12-17 NOTE — Assessment & Plan Note (Signed)
Adequate control. 

## 2013-01-02 ENCOUNTER — Emergency Department (HOSPITAL_COMMUNITY)
Admission: EM | Admit: 2013-01-02 | Discharge: 2013-01-02 | Disposition: A | Discharge status: Home / Self Care | Payer: Medicare Other | Attending: Emergency Medicine | Admitting: Emergency Medicine

## 2013-01-02 ENCOUNTER — Encounter (HOSPITAL_COMMUNITY): Payer: Self-pay | Admitting: Emergency Medicine

## 2013-01-02 ENCOUNTER — Emergency Department (HOSPITAL_COMMUNITY): Payer: Medicare Other

## 2013-01-02 DIAGNOSIS — M5137 Other intervertebral disc degeneration, lumbosacral region: Secondary | ICD-10-CM | POA: Insufficient documentation

## 2013-01-02 DIAGNOSIS — I252 Old myocardial infarction: Secondary | ICD-10-CM | POA: Insufficient documentation

## 2013-01-02 DIAGNOSIS — Z8739 Personal history of other diseases of the musculoskeletal system and connective tissue: Secondary | ICD-10-CM | POA: Insufficient documentation

## 2013-01-02 DIAGNOSIS — I10 Essential (primary) hypertension: Secondary | ICD-10-CM | POA: Insufficient documentation

## 2013-01-02 DIAGNOSIS — Z862 Personal history of diseases of the blood and blood-forming organs and certain disorders involving the immune mechanism: Secondary | ICD-10-CM | POA: Insufficient documentation

## 2013-01-02 DIAGNOSIS — M5030 Other cervical disc degeneration, unspecified cervical region: Secondary | ICD-10-CM | POA: Insufficient documentation

## 2013-01-02 DIAGNOSIS — M542 Cervicalgia: Secondary | ICD-10-CM | POA: Insufficient documentation

## 2013-01-02 DIAGNOSIS — Z8639 Personal history of other endocrine, nutritional and metabolic disease: Secondary | ICD-10-CM | POA: Insufficient documentation

## 2013-01-02 DIAGNOSIS — M549 Dorsalgia, unspecified: Secondary | ICD-10-CM | POA: Insufficient documentation

## 2013-01-02 DIAGNOSIS — G8929 Other chronic pain: Secondary | ICD-10-CM | POA: Insufficient documentation

## 2013-01-02 DIAGNOSIS — I2510 Atherosclerotic heart disease of native coronary artery without angina pectoris: Secondary | ICD-10-CM | POA: Insufficient documentation

## 2013-01-02 DIAGNOSIS — Z950 Presence of cardiac pacemaker: Secondary | ICD-10-CM | POA: Insufficient documentation

## 2013-01-02 DIAGNOSIS — R197 Diarrhea, unspecified: Secondary | ICD-10-CM | POA: Insufficient documentation

## 2013-01-02 DIAGNOSIS — M51379 Other intervertebral disc degeneration, lumbosacral region without mention of lumbar back pain or lower extremity pain: Secondary | ICD-10-CM | POA: Insufficient documentation

## 2013-01-02 DIAGNOSIS — Z7982 Long term (current) use of aspirin: Secondary | ICD-10-CM | POA: Insufficient documentation

## 2013-01-02 DIAGNOSIS — Z79899 Other long term (current) drug therapy: Secondary | ICD-10-CM | POA: Insufficient documentation

## 2013-01-02 DIAGNOSIS — Z880 Allergy status to penicillin: Secondary | ICD-10-CM | POA: Insufficient documentation

## 2013-01-02 DIAGNOSIS — R112 Nausea with vomiting, unspecified: Secondary | ICD-10-CM | POA: Insufficient documentation

## 2013-01-02 HISTORY — DX: Other intervertebral disc degeneration, lumbar region without mention of lumbar back pain or lower extremity pain: M51.369

## 2013-01-02 HISTORY — DX: Cervicalgia: M54.2

## 2013-01-02 HISTORY — DX: Radiculopathy, site unspecified: M54.10

## 2013-01-02 HISTORY — DX: Other cervical disc degeneration, unspecified cervical region: M50.30

## 2013-01-02 HISTORY — DX: Dorsalgia, unspecified: M54.9

## 2013-01-02 HISTORY — DX: Other intervertebral disc degeneration, lumbar region: M51.36

## 2013-01-02 HISTORY — DX: Other chronic pain: G89.29

## 2013-01-02 HISTORY — DX: Hyperlipidemia, unspecified: E78.5

## 2013-01-02 LAB — URINALYSIS, ROUTINE W REFLEX MICROSCOPIC
Glucose, UA: NEGATIVE mg/dL
Leukocytes, UA: NEGATIVE
Protein, ur: NEGATIVE mg/dL
Specific Gravity, Urine: 1.012 (ref 1.005–1.030)
Urobilinogen, UA: 0.2 mg/dL (ref 0.0–1.0)

## 2013-01-02 LAB — CBC WITH DIFFERENTIAL/PLATELET
Basophils Relative: 0 % (ref 0–1)
Eosinophils Absolute: 0 10*3/uL (ref 0.0–0.7)
Eosinophils Relative: 0 % (ref 0–5)
Hemoglobin: 14.7 g/dL (ref 12.0–15.0)
Lymphs Abs: 0.8 10*3/uL (ref 0.7–4.0)
MCH: 31.3 pg (ref 26.0–34.0)
MCHC: 33.5 g/dL (ref 30.0–36.0)
MCV: 93.4 fL (ref 78.0–100.0)
Monocytes Absolute: 0.4 10*3/uL (ref 0.1–1.0)
Monocytes Relative: 11 % (ref 3–12)
RBC: 4.7 MIL/uL (ref 3.87–5.11)

## 2013-01-02 LAB — URINE MICROSCOPIC-ADD ON

## 2013-01-02 LAB — LACTIC ACID, PLASMA: Lactic Acid, Venous: 1.4 mmol/L (ref 0.5–2.2)

## 2013-01-02 LAB — COMPREHENSIVE METABOLIC PANEL
Alkaline Phosphatase: 58 U/L (ref 39–117)
BUN: 17 mg/dL (ref 6–23)
Calcium: 9.1 mg/dL (ref 8.4–10.5)
Creatinine, Ser: 0.9 mg/dL (ref 0.50–1.10)
GFR calc Af Amer: 64 mL/min — ABNORMAL LOW (ref >90)
Glucose, Bld: 106 mg/dL — ABNORMAL HIGH (ref 70–99)
Potassium: 3.8 mEq/L (ref 3.5–5.1)
Total Protein: 6.9 g/dL (ref 6.0–8.3)

## 2013-01-02 LAB — LIPASE, BLOOD: Lipase: 18 U/L (ref 11–59)

## 2013-01-02 LAB — OCCULT BLOOD, POC DEVICE: Fecal Occult Bld: NEGATIVE

## 2013-01-02 MED ORDER — ONDANSETRON HCL 4 MG PO TABS: 4.0000 mg | ORAL_TABLET | Freq: Three times a day (TID) | ORAL | Status: DC | PRN

## 2013-01-02 MED ORDER — IOHEXOL 300 MG/ML  SOLN
50.0000 mL | Freq: Once | INTRAMUSCULAR | Status: AC | PRN
  Administered 2013-01-02: 50 mL via ORAL

## 2013-01-02 MED ORDER — SODIUM CHLORIDE 0.9 % IV SOLN
INTRAVENOUS | Status: DC
  Administered 2013-01-02: 12:00:00 via INTRAVENOUS

## 2013-01-02 MED ORDER — IOHEXOL 300 MG/ML  SOLN
100.0000 mL | Freq: Once | INTRAMUSCULAR | Status: AC | PRN
  Administered 2013-01-02: 100 mL via INTRAVENOUS

## 2013-01-02 MED ORDER — ONDANSETRON HCL 4 MG/2ML IJ SOLN
4.0000 mg | INTRAMUSCULAR | Status: DC | PRN
  Administered 2013-01-02: 4 mg via INTRAVENOUS
  Filled 2013-01-02: qty 2

## 2013-01-02 MED ORDER — FENTANYL CITRATE 0.05 MG/ML IJ SOLN
12.5000 ug | INTRAMUSCULAR | Status: DC | PRN
  Administered 2013-01-02: 12.5 ug via INTRAVENOUS
  Filled 2013-01-02: qty 2

## 2013-01-02 NOTE — ED Notes (Signed)
Patient brought in via [facility] C/O diarrhea since Thursday, patient lives alone episode started N and V on Thursday PM and that ended not complains of diarrhea only, denies pain, uses walker to ambulate

## 2013-01-02 NOTE — ED Notes (Signed)
Contact Krista Catalina Sr., Cell: [phone], House: [phone]

## 2013-01-02 NOTE — ED Notes (Signed)
Pt did well with fluid challenge, states she feels better and is ready to return home.

## 2013-01-02 NOTE — ED Provider Notes (Signed)
History     CSN: [id number]  Arrival date & time 01/02/13  0930   First MD Initiated Contact with Patient 01/02/13 0932      Chief Complaint  Patient presents with  . Diarrhea  . Nausea  . Vomiting    HPI Pt was seen at 0940.  Per pt, c/o gradual onset and persistence of multiple intermittent episodes of N/V/D that began 3 days ago.   Describes the stools as "watery." States the N/V has improved but she continues to have diarrhea.  Denies abd pain, no CP/SOB, no back pain, no fevers, no black or blood in stools or emesis.     Past Medical History  Diagnosis Date  . Pacemaker   . Coronary artery disease   . MI (myocardial infarction)   . Hypertension   . Hyperlipemia   . Chronic back pain   . DDD (degenerative disc disease), lumbar   . DDD (degenerative disc disease), cervical   . Chronic neck pain   . Radiculopathy     Past Surgical History  Procedure Laterality Date  . Pacemaker insertion      left side  . Appendectomy    . Abdominal hysterectomy    . Cataract extraction Right   . Back surgery  2004     History  Substance Use Topics  . Smoking status: Never Smoker   . Smokeless tobacco: Not on file  . Alcohol Use: No      Review of Systems ROS: Statement: All systems negative except as marked or noted in the HPI; Constitutional: Negative for fever and chills. ; ; Eyes: Negative for eye pain, redness and discharge. ; ; ENMT: Negative for ear pain, hoarseness, nasal congestion, sinus pressure and sore throat. ; ; Cardiovascular: Negative for chest pain, palpitations, diaphoresis, dyspnea and peripheral edema. ; ; Respiratory: Negative for cough, wheezing and stridor. ; ; Gastrointestinal: +N/V/D. Negative for abdominal pain, blood in stool, hematemesis, jaundice and rectal bleeding. . ; ; Genitourinary: Negative for dysuria, flank pain and hematuria. ; ; Musculoskeletal: Negative for back pain and neck pain. Negative for swelling and trauma.; ; Skin: Negative for  pruritus, rash, abrasions, blisters, bruising and skin lesion.; ; Neuro: Negative for headache, lightheadedness and neck stiffness. Negative for weakness, altered level of consciousness , altered mental status, extremity weakness, paresthesias, involuntary movement, seizure and syncope.       Allergies  Penicillins  Home Medications   Current Outpatient Rx  Name  Route  Sig  Dispense  Refill  . amLODipine (NORVASC) 5 MG tablet   Oral   Take 5 mg by mouth daily.           . Aspirin (ASPIR-81 PO)   Oral   Take 162 mg by mouth daily.           . Calcium Carbonate-Vitamin D (CALTRATE 600+D) 600-400 MG-UNIT per tablet   Oral   Take 1 tablet by mouth 2 (two) times daily.           Marland Kitchen ezetimibe (ZETIA) 10 MG tablet   Oral   Take 10 mg by mouth daily.         . furosemide (LASIX) 20 MG tablet   Oral   Take 20 mg by mouth daily as needed (swelling).         . metoprolol succinate (TOPROL-XL) 25 MG 24 hr tablet   Oral   Take 25 mg by mouth daily.         Marland Kitchen  Vitamin D, Ergocalciferol, (DRISDOL) 50000 UNITS CAPS   Oral   Take 50,000 Units by mouth every 7 (seven) days. Take on sundays            BP 122/58  Pulse 79  Temp(Src) 98.1 F (36.7 C) (Oral)  Resp 18  SpO2 96%  Physical Exam 0945: Physical examination:  Nursing notes reviewed; Vital signs and O2 SAT reviewed;  Constitutional: Well developed, Well nourished, In no acute distress; Head:  Normocephalic, atraumatic; Eyes: EOMI, PERRL, No scleral icterus; ENMT: Mouth and pharynx normal, Mucous membranes dry; Neck: Supple, Full range of motion, No lymphadenopathy; Cardiovascular: Regular rate and rhythm, No gallop; Respiratory: Breath sounds clear & equal bilaterally, No rales, rhonchi, wheezes.  Speaking full sentences with ease, Normal respiratory effort/excursion; Chest: Nontender, Movement normal; Abdomen: Soft, Nontender, Nondistended, Normal bowel sounds. Rectal exam performed w/permission of pt and ED RN  chaperone present.  Anal tone normal.  Non-tender, soft yellow stool in rectal vault, heme neg.  No fissures, no external hemorrhoids, no palp masses.;;; Genitourinary: No CVA tenderness; Extremities: Pulses normal, No tenderness, No edema, No calf edema or asymmetry.; Neuro: AA&Ox3, Major CN grossly intact.  Speech clear. No gross focal motor or sensory deficits in extremities.; Skin: Color normal, Warm, Dry.   ED Course  Procedures      MDM  MDM Reviewed: previous chart, nursing note and vitals Reviewed previous: labs and ECG Interpretation: labs, ECG, x-ray and CT scan    Date: 01/02/2013  Rate: 75  Rhythm: normal sinus rhythm and premature atrial contractions (PAC)  QRS Axis: normal  Intervals: normal  ST/T Wave abnormalities: normal  Conduction Disutrbances:right bundle branch block and left posterior fascicular block  Narrative Interpretation:   Old EKG Reviewed: unchanged; no significant changes from previous EKG dated 05/21/2011.  Results for orders placed during the hospital encounter of 01/02/13  CLOSTRIDIUM DIFFICILE BY PCR      Result Value Range   C difficile by pcr NEGATIVE  NEGATIVE  URINALYSIS, ROUTINE W REFLEX MICROSCOPIC      Result Value Range   Color, Urine YELLOW  YELLOW   APPearance CLEAR  CLEAR   Specific Gravity, Urine 1.012  1.005 - 1.030   pH 5.5  5.0 - 8.0   Glucose, UA NEGATIVE  NEGATIVE mg/dL   Hgb urine dipstick LARGE (*) NEGATIVE   Bilirubin Urine NEGATIVE  NEGATIVE   Ketones, ur NEGATIVE  NEGATIVE mg/dL   Protein, ur NEGATIVE  NEGATIVE mg/dL   Urobilinogen, UA 0.2  0.0 - 1.0 mg/dL   Nitrite NEGATIVE  NEGATIVE   Leukocytes, UA NEGATIVE  NEGATIVE  CBC WITH DIFFERENTIAL      Result Value Range   WBC 3.8 (*) 4.0 - 10.5 K/uL   RBC 4.70  3.87 - 5.11 MIL/uL   Hemoglobin 14.7  12.0 - 15.0 g/dL   HCT 16.1  09.6 - 04.5 %   MCV 93.4  78.0 - 100.0 fL   MCH 31.3  26.0 - 34.0 pg   MCHC 33.5  30.0 - 36.0 g/dL   RDW 40.9  81.1 - 91.4 %   Platelets  197  150 - 400 K/uL   Neutrophils Relative % 66  43 - 77 %   Neutro Abs 2.6  1.7 - 7.7 K/uL   Lymphocytes Relative 22  12 - 46 %   Lymphs Abs 0.8  0.7 - 4.0 K/uL   Monocytes Relative 11  3 - 12 %   Monocytes Absolute 0.4  0.1 -  1.0 K/uL   Eosinophils Relative 0  0 - 5 %   Eosinophils Absolute 0.0  0.0 - 0.7 K/uL   Basophils Relative 0  0 - 1 %   Basophils Absolute 0.0  0.0 - 0.1 K/uL  COMPREHENSIVE METABOLIC PANEL      Result Value Range   Sodium 137  135 - 145 mEq/L   Potassium 3.8  3.5 - 5.1 mEq/L   Chloride 101  96 - 112 mEq/L   CO2 25  19 - 32 mEq/L   Glucose, Bld 106 (*) 70 - 99 mg/dL   BUN 17  6 - 23 mg/dL   Creatinine, Ser 2.13  0.50 - 1.10 mg/dL   Calcium 9.1  8.4 - 08.6 mg/dL   Total Protein 6.9  6.0 - 8.3 g/dL   Albumin 3.3 (*) 3.5 - 5.2 g/dL   AST 25  0 - 37 U/L   ALT 19  0 - 35 U/L   Alkaline Phosphatase 58  39 - 117 U/L   Total Bilirubin 0.7  0.3 - 1.2 mg/dL   GFR calc non Af Amer 55 (*) >90 mL/min   GFR calc Af Amer 64 (*) >90 mL/min  LIPASE, BLOOD      Result Value Range   Lipase 18  11 - 59 U/L  LACTIC ACID, PLASMA      Result Value Range   Lactic Acid, Venous 1.4  0.5 - 2.2 mmol/L  TROPONIN I      Result Value Range   Troponin I <0.30  <0.30 ng/mL  URINE MICROSCOPIC-ADD ON      Result Value Range   Squamous Epithelial / LPF RARE  RARE   WBC, UA 0-2  <3 WBC/hpf   RBC / HPF 11-20  <3 RBC/hpf   Casts HYALINE CASTS (*) NEGATIVE  OCCULT BLOOD, POC DEVICE      Result Value Range   Fecal Occult Bld NEGATIVE  NEGATIVE   Dg Chest 2 View 01/02/2013   *RADIOLOGY REPORT*  Clinical Data: Nausea and vomiting.  CHEST - 2 VIEW  Comparison: 05/21/2011  Findings: Stable appearance of dual chamber pacemaker.  Heart size is normal.  No edema, infiltrate or pleural fluid is identified. The bony thorax is unremarkable.  IMPRESSION: No active disease.   Original Report Authenticated By: Irish Lack, M.D.   Ct Abdomen Pelvis W Contrast 01/02/2013   *RADIOLOGY REPORT*   Clinical Data: Diarrhea, nausea, and vomiting  CT ABDOMEN AND PELVIS WITH CONTRAST  Technique:  Multidetector CT imaging of the abdomen and pelvis was performed following the standard protocol during bolus administration of intravenous contrast.  Contrast: OMNIPAQUE IOHEXOL 300 MG/ML  SOLN  Comparison: None.  Findings: Lung bases are clear.  No pericardial fluid.  There is no focal hepatic lesion.  The gallbladder, pancreas, spleen, and kidneys are normal.  Adrenal glands are mildly thickened likely represent hyperplasia.  There is a low density nonenhancing cyst within the lower pole right kidney.  The stomach, small bowel, and colon are normal.  Abdominal aorta normal caliber.  No retroperitoneal or periportal lymphadenopathy.  Atherosclerotic calcification of the aorta is present.  No free fluid the pelvis.  Post hysterectomy anatomy.  No pelvic lymphadenopathy. Review of  bone windows demonstrates no aggressive osseous lesions.  IMPRESSION:  1.  No acute abdominal  or pelvic findings. 2. Atherosclerotic disease of the aorta.   Original Report Authenticated By: Genevive Bi, M.D.    1530:  Pt has tol PO well  while in the ED without N/V.  No stooling while in the ED.  Abd remains benign, VSS. Pt is not orthostatic.  She wants to go home now. Dx and testing d/w pt.  Questions answered.  Verb understanding, agreeable to d/c home with outpt f/u.             Laray Anger, DO 01/05/13 Mallie Snooks

## 2013-01-02 NOTE — ED Notes (Signed)
Placed on contact iisolation

## 2013-01-02 NOTE — ED Notes (Signed)
Patient on contact Isolation

## 2013-01-02 NOTE — ED Notes (Signed)
Patient back from x-ray 

## 2013-01-02 NOTE — ED Notes (Signed)
Pt unable to stand for orthostatic vital signs, due to pain in leg.

## 2013-01-02 NOTE — ED Notes (Signed)
Patient transported to CT 

## 2013-01-03 LAB — URINE CULTURE

## 2013-01-06 LAB — STOOL CULTURE

## 2013-01-20 ENCOUNTER — Encounter: Payer: Self-pay | Admitting: Cardiovascular Disease

## 2013-02-22 ENCOUNTER — Other Ambulatory Visit (INDEPENDENT_AMBULATORY_CARE_PROVIDER_SITE_OTHER): Payer: Medicare Other

## 2013-02-22 ENCOUNTER — Encounter: Payer: Self-pay | Admitting: Internal Medicine

## 2013-02-22 ENCOUNTER — Ambulatory Visit (INDEPENDENT_AMBULATORY_CARE_PROVIDER_SITE_OTHER): Payer: Medicare Other | Admitting: Internal Medicine

## 2013-02-22 VITALS — BP 130/80 | HR 92 | Temp 98.1°F | Ht 61.0 in | Wt 148.0 lb

## 2013-02-22 DIAGNOSIS — E785 Hyperlipidemia, unspecified: Secondary | ICD-10-CM

## 2013-02-22 DIAGNOSIS — I10 Essential (primary) hypertension: Secondary | ICD-10-CM | POA: Insufficient documentation

## 2013-02-22 DIAGNOSIS — R7309 Other abnormal glucose: Secondary | ICD-10-CM

## 2013-02-22 DIAGNOSIS — E7800 Pure hypercholesterolemia, unspecified: Secondary | ICD-10-CM | POA: Insufficient documentation

## 2013-02-22 LAB — LIPID PANEL
HDL: 64.2 mg/dL (ref >39.00)
Total CHOL/HDL Ratio: 3
Triglycerides: 174 mg/dL — ABNORMAL HIGH (ref 0.0–149.0)

## 2013-02-22 LAB — CBC
Hemoglobin: 15.4 g/dL — ABNORMAL HIGH (ref 12.0–15.0)
MCHC: 33.4 g/dL (ref 30.0–36.0)
MCV: 93.8 fl (ref 78.0–100.0)
Platelets: 247 10*3/uL (ref 150.0–400.0)

## 2013-02-22 LAB — COMPREHENSIVE METABOLIC PANEL
AST: 22 U/L (ref 0–37)
Alkaline Phosphatase: 62 U/L (ref 39–117)
BUN: 21 mg/dL (ref 6–23)
Creatinine, Ser: 0.9 mg/dL (ref 0.4–1.2)
Potassium: 4.9 mEq/L (ref 3.5–5.1)

## 2013-02-22 LAB — HEMOGLOBIN A1C: Hgb A1c MFr Bld: 6.3 % (ref 4.6–6.5)

## 2013-02-22 NOTE — Patient Instructions (Signed)

## 2013-02-22 NOTE — Assessment & Plan Note (Signed)
Well controlled Will check lipids today

## 2013-02-22 NOTE — Assessment & Plan Note (Signed)
Well controlled Continue current therapy Continue to follow with cardiology

## 2013-02-22 NOTE — Progress Notes (Signed)
HPI  Pt presents to the clinic today to establish care. She is transferring care from Dr. Carolyn Stare office. She does have some concerns about her memory today. She feels like she is forgetful. She has trouble remembering where she put things. She has no history of dementia or Alzheimer's disease.  Flu: yearly Tetanus: 2007 Pneumovax: 2014 Zostovax: never Bone scan: 2014 Mammograms: 2014 Colonoscopy: 2007 Eye doctor: yearly Dentist: yearly     Past Medical History  Diagnosis Date  . Pacemaker   . Coronary artery disease   . MI (myocardial infarction)   . Hypertension   . Hyperlipemia   . Chronic back pain   . DDD (degenerative disc disease), lumbar   . DDD (degenerative disc disease), cervical   . Chronic neck pain   . Radiculopathy     Current Outpatient Prescriptions  Medication Sig Dispense Refill  . amLODipine (NORVASC) 5 MG tablet Take 5 mg by mouth daily.        . Aspirin (ASPIR-81 PO) Take 162 mg by mouth 2 (two) times daily.       . Calcium Carbonate-Vitamin D (CALTRATE 600+D) 600-400 MG-UNIT per tablet Take 1 tablet by mouth 2 (two) times daily.        Marland Kitchen ezetimibe (ZETIA) 10 MG tablet Take 10 mg by mouth daily.      . furosemide (LASIX) 20 MG tablet Take 20 mg by mouth daily as needed (swelling).      . hydrocortisone cream 0.5 % Apply topically as needed.      . metoprolol succinate (TOPROL-XL) 25 MG 24 hr tablet Take 25 mg by mouth daily.      . Multiple Vitamins-Minerals (CENTRUM SILVER ULTRA WOMENS PO) Take by mouth daily.      . Vitamin D, Ergocalciferol, (DRISDOL) 50000 UNITS CAPS Take 50,000 Units by mouth every 7 (seven) days. Take on sundays       . ondansetron (ZOFRAN) 4 MG tablet Take 1 tablet (4 mg total) by mouth every 8 (eight) hours as needed for nausea.  6 tablet  0   No current facility-administered medications for this visit.    Allergies  Allergen Reactions  . Penicillins     Unknown been years ago    History reviewed. No pertinent  family history.  History   Social History  . Marital Status: Widowed    Spouse Name: N/A    Number of Children: N/A  . Years of Education: N/A   Occupational History  . Not on file.   Social History Main Topics  . Smoking status: Never Smoker   . Smokeless tobacco: Not on file  . Alcohol Use: No  . Drug Use: No  . Sexually Active: Not on file   Other Topics Concern  . Not on file   Social History Narrative  . No narrative on file    ROS:  Constitutional: Denies fever, malaise, fatigue, headache or abrupt weight changes.  HEENT: Denies eye pain, eye redness, ear pain, ringing in the ears, wax buildup, runny nose, nasal congestion, bloody nose, or sore throat. Respiratory: Denies difficulty breathing, shortness of breath, cough or sputum production.   Cardiovascular: Denies chest pain, chest tightness, palpitations or swelling in the hands or feet.  Gastrointestinal: Denies abdominal pain, bloating, constipation, diarrhea or blood in the stool.  GU: Pt reports frequency. Denies urgency, pain with urination, blood in urine, odor or discharge. Musculoskeletal: Pt reports occasional leg pain d/t sciatica. Denies decrease in range of motion, difficulty with  gait, muscle pain or joint pain and swelling.  Skin: Denies redness, rashes, lesions or ulcercations.  Neurological: Pt reports difficulty with memory. Denies dizziness,difficulty with speech or problems with balance and coordination.   No other specific complaints in a complete review of systems (except as listed in HPI above).  PE:  BP 130/80  Pulse 92  Temp(Src) 98.1 F (36.7 C) (Oral)  Ht 5\' 1"  (1.549 m)  Wt 148 lb (67.132 kg)  BMI 27.98 kg/m2  SpO2 97% Wt Readings from Last 3 Encounters:  02/22/13 148 lb (67.132 kg)  12/17/12 151 lb (68.493 kg)  05/21/11 160 lb (72.576 kg)    General: Appears her stated age, well developed, well nourished in NAD. HEENT: Head: normal shape and size, mild hair loss; Eyes:  sclera white, no icterus, conjunctiva pink, PERRLA and EOMs intact; Ears: Tm's gray and intact, normal light reflex; Nose: mucosa pink and moist, septum midline; Throat/Mouth: Teeth missing, mucosa pink and moist, no lesions or ulcerations noted.  Neck: Normal range of motion. Neck supple, trachea midline. No massses, lumps or thyromegaly present.  Cardiovascular: Normal rate and rhythm. S1,S2 noted.  No murmur, rubs or gallops noted. No JVD or BLE edema. No carotid bruits noted. Pacemaker noted in left upper chest. Pulmonary/Chest: Normal effort and positive vesicular breath sounds. No respiratory distress. No wheezes, rales or ronchi noted.  Abdomen: Soft and nontender. Normal bowel sounds, no bruits noted. No distention or masses noted. Liver, spleen and kidneys non palpable. Musculoskeletal: Normal range of motion. No signs of joint swelling. No difficulty with gait.  Neurological: Alert and oriented. Cranial nerves II-XII intact. Coordination normal. +DTRs bilaterally. Psychiatric: Mood and affect normal. Behavior is normal. Judgment and thought content normal.    BMET    Component Value Date/Time   NA 137 01/02/2013 0951   K 3.8 01/02/2013 0951   CL 101 01/02/2013 0951   CO2 25 01/02/2013 0951   GLUCOSE 106* 01/02/2013 0951   BUN 17 01/02/2013 0951   CREATININE 0.90 01/02/2013 0951   CALCIUM 9.1 01/02/2013 0951   GFRNONAA 55* 01/02/2013 0951   GFRAA 64* 01/02/2013 0951    Lipid Panel  No results found for this basename: chol, trig, hdl, cholhdl, vldl, ldlcalc    CBC    Component Value Date/Time   WBC 3.8* 01/02/2013 0951   RBC 4.70 01/02/2013 0951   HGB 14.7 01/02/2013 0951   HCT 43.9 01/02/2013 0951   PLT 197 01/02/2013 0951   MCV 93.4 01/02/2013 0951   MCH 31.3 01/02/2013 0951   MCHC 33.5 01/02/2013 0951   RDW 13.5 01/02/2013 0951   LYMPHSABS 0.8 01/02/2013 0951   MONOABS 0.4 01/02/2013 0951   EOSABS 0.0 01/02/2013 0951   BASOSABS 0.0 01/02/2013 0951       Assessment and  Plan:  Prevent Health:  All HM UTD Will obtain records from Dr. Carolyn Stare office

## 2013-02-25 ENCOUNTER — Other Ambulatory Visit: Payer: Self-pay | Admitting: Internal Medicine

## 2013-02-25 MED ORDER — SIMVASTATIN 10 MG PO TABS: 10.0000 mg | ORAL_TABLET | Freq: Every day | ORAL | Status: DC

## 2013-03-08 ENCOUNTER — Telehealth: Payer: Self-pay | Admitting: Cardiovascular Disease

## 2013-03-08 NOTE — Telephone Encounter (Signed)
Returned call.  Pt with concerns about f/u appts.  Stated it says to f/u in one year w/ doctor and in Sept 2014 w/ Pacer Clinic.  Pt informed she is to see Dr. Salena Saner in one year and be seen in pacemaker clinic next month.  Pt wanted to schedule appt and transferred to [facility], CMA to set up appt.

## 2013-03-08 NOTE — Telephone Encounter (Signed)
Wants to read something she received after her last treatment here that she does not understand

## 2013-03-23 ENCOUNTER — Ambulatory Visit (INDEPENDENT_AMBULATORY_CARE_PROVIDER_SITE_OTHER): Payer: Medicare Other | Admitting: *Deleted

## 2013-03-23 DIAGNOSIS — I442 Atrioventricular block, complete: Secondary | ICD-10-CM

## 2013-03-23 LAB — PACEMAKER DEVICE OBSERVATION
AL AMPLITUDE: 2.4 mv
BAMS-0001: 150 {beats}/min
BATTERY VOLTAGE: 2.96 V
RV LEAD IMPEDENCE PM: 510 Ohm
VENTRICULAR PACING PM: 1

## 2013-03-23 NOTE — Patient Instructions (Addendum)
Your physician recommends that you schedule a follow-up appointment in: December for a pacemaker interrogation

## 2013-03-23 NOTE — Progress Notes (Signed)
In office pacemaker interrogation. Normal device function. No changes made this session. 

## 2013-05-04 ENCOUNTER — Encounter: Payer: Self-pay | Admitting: Cardiovascular Disease

## 2013-05-04 ENCOUNTER — Ambulatory Visit (INDEPENDENT_AMBULATORY_CARE_PROVIDER_SITE_OTHER): Payer: Medicare Other | Admitting: Cardiovascular Disease

## 2013-05-04 VITALS — BP 150/80 | HR 70 | Ht 61.0 in | Wt 149.4 lb

## 2013-05-04 DIAGNOSIS — Z950 Presence of cardiac pacemaker: Secondary | ICD-10-CM

## 2013-05-04 DIAGNOSIS — K3189 Other diseases of stomach and duodenum: Secondary | ICD-10-CM

## 2013-05-04 DIAGNOSIS — E785 Hyperlipidemia, unspecified: Secondary | ICD-10-CM

## 2013-05-04 DIAGNOSIS — R1013 Epigastric pain: Secondary | ICD-10-CM | POA: Insufficient documentation

## 2013-05-04 DIAGNOSIS — I2510 Atherosclerotic heart disease of native coronary artery without angina pectoris: Secondary | ICD-10-CM

## 2013-05-04 DIAGNOSIS — I10 Essential (primary) hypertension: Secondary | ICD-10-CM

## 2013-05-04 DIAGNOSIS — M47816 Spondylosis without myelopathy or radiculopathy, lumbar region: Secondary | ICD-10-CM

## 2013-05-04 DIAGNOSIS — M47817 Spondylosis without myelopathy or radiculopathy, lumbosacral region: Secondary | ICD-10-CM

## 2013-05-04 NOTE — Progress Notes (Signed)
Patient ID: Krista Ray, female   DOB: May 11, 1923, 79 y.o.   MRN: [id number]     HPI: TSERING LEAMAN is a 79 y.o. female who presents to the office today for a two-month followup cardiology evaluation.  Ms. Bryttany Tortorelli is a very pleasant 79 year old female who developed third degree heart block and underwent permanent pacemaker insertion on 07/17/2009 with a St. Jude's dual-chamber pacemaker. Additional problems include hypertension with grade 1 diastolic dysfunction, hyperlipidemia, aortic valve sclerosis, mild to moderate tricuspid and mitral regurgitation documented on echo Doppler study in 2011 as well as osteoarthritis and degenerative lumbar spine disease. She recently has noticed episodes of sciatica discomfort and apparently this has somewhat improved with her water aerobics she also does note some dyspeptic symptoms. Remotely, she did have a nuclear perfusion study in 2011 which showed normal perfusion. Dr. Ladona Ridgel had placed her pacemaker in 2011 per Dr. Rubie Maid has been following her pacemaker with his last assessment in March 2014.  She denies exertional chest pain. She denies dyspnea. She denies syncope.  Past Medical History  Diagnosis Date  . Pacemaker   . Coronary artery disease   . MI (myocardial infarction)   . Hypertension   . Hyperlipemia   . Chronic back pain   . DDD (degenerative disc disease), lumbar   . DDD (degenerative disc disease), cervical   . Chronic neck pain   . Radiculopathy   . Sciatic nerve pain     Past Surgical History  Procedure Laterality Date  . Pacemaker insertion      left side  . Appendectomy    . Abdominal hysterectomy    . Cataract extraction Right   . Back surgery  2004    Allergies  Allergen Reactions  . Penicillins     Unknown been years ago    Current Outpatient Prescriptions  Medication Sig Dispense Refill  . amLODipine (NORVASC) 5 MG tablet Take 5 mg by mouth daily.        . Aspirin (ASPIR-81 PO) Take 162 mg by  mouth 2 (two) times daily.       . Calcium Carbonate-Vitamin D (CALTRATE 600+D) 600-400 MG-UNIT per tablet Take 1 tablet by mouth 2 (two) times daily.        Marland Kitchen ezetimibe (ZETIA) 10 MG tablet Take 10 mg by mouth daily.      . furosemide (LASIX) 20 MG tablet Take 20 mg by mouth daily as needed (swelling).      . hydrocortisone cream 0.5 % Apply topically as needed.      . metoprolol succinate (TOPROL-XL) 25 MG 24 hr tablet Take 25 mg by mouth daily.      . Multiple Vitamins-Minerals (CENTRUM SILVER ULTRA WOMENS PO) Take by mouth daily.      . ondansetron (ZOFRAN) 4 MG tablet Take 1 tablet (4 mg total) by mouth every 8 (eight) hours as needed for nausea.  6 tablet  0  . simvastatin (ZOCOR) 10 MG tablet Take 1 tablet (10 mg total) by mouth at bedtime.  90 tablet  3  . Vitamin D, Ergocalciferol, (DRISDOL) 50000 UNITS CAPS Take 50,000 Units by mouth every 7 (seven) days. Take on sundays        No current facility-administered medications for this visit.    History   Social History  . Marital Status: Widowed    Spouse Name: N/A    Number of Children: N/A  . Years of Education: N/A   Occupational History  . Not on  file.   Social History Main Topics  . Smoking status: Never Smoker   . Smokeless tobacco: Not on file  . Alcohol Use: No  . Drug Use: No  . Sexual Activity: No   Other Topics Concern  . Not on file   Social History Narrative  . No narrative on file    Family History  Problem Relation Age of Onset  . Stroke Mother   . Heart disease Father   . Heart disease Sister   . Cancer Brother   . Diabetes Neg Hx     ROS is negative for fevers, chills or night sweats. She denies presyncope or syncope. She denies wheezing. She denies cough or sputum production. She denies exertional chest pain. She does note indigestion-like symptoms which do improve with burping. She denies change in bowel or bladder habits. She denies blood in her urine or stool. She denies claudication. She  does have sciatica problems. She walks with a cane. She denies rash.   Other comprehensive 12 point system review is negative.  PE BP 150/80  Pulse 70  Ht 5\' 1"  (1.549 m)  Wt 149 lb 6.4 oz (67.767 kg)  BMI 28.24 kg/m2  General: Alert, oriented, no distress.  Skin: normal turgor, no rashes HEENT: Normocephalic, atraumatic. Pupils round and reactive; sclera anicteric;no lid lag.  Nose without nasal septal hypertrophy Mouth/Parynx benign; Mallinpatti scale 3 Neck: No JVD, no carotid briuts Lungs: clear to ausculatation and percussion; no wheezing or rales Heart: RRR, s1 s2 normal 1-2/6 systolic murmur Abdomen: soft, nontender; no hepatosplenomehaly, BS+; abdominal aorta nontender and not dilated by palpation. Pulses 2+ Extremities: no clubbing cyanosis or edema, Homan's sign negative  Neurologic: grossly nonfocal Psychologic: normal affect and mood.  ECG: Normal sinus rhythm with incomplete right bundle branch block. PR interval 128 ms. Most of the time she appears to be atrial and ventricular sensing but she did have several atrial paced beats.  LABS:  BMET    Component Value Date/Time   NA 143 02/22/2013 1006   K 4.9 02/22/2013 1006   CL 104 02/22/2013 1006   CO2 28 02/22/2013 1006   GLUCOSE 98 02/22/2013 1006   BUN 21 02/22/2013 1006   CREATININE 0.9 02/22/2013 1006   CALCIUM 10.7* 02/22/2013 1006   GFRNONAA 55* 01/02/2013 0951   GFRAA 64* 01/02/2013 0951     Hepatic Function Panel     Component Value Date/Time   PROT 7.8 02/22/2013 1006   ALBUMIN 3.9 02/22/2013 1006   AST 22 02/22/2013 1006   ALT 21 02/22/2013 1006   ALKPHOS 62 02/22/2013 1006   BILITOT 0.7 02/22/2013 1006   BILIDIR 0.2 07/15/2009 1240   IBILI 0.4 07/15/2009 1240     CBC    Component Value Date/Time   WBC 6.6 02/22/2013 1006   RBC 4.93 02/22/2013 1006   HGB 15.4* 02/22/2013 1006   HCT 46.2* 02/22/2013 1006   PLT 247.0 02/22/2013 1006   MCV 93.8 02/22/2013 1006   MCH 31.3 01/02/2013 0951   MCHC 33.4 02/22/2013  1006   RDW 13.8 02/22/2013 1006   LYMPHSABS 0.8 01/02/2013 0951   MONOABS 0.4 01/02/2013 0951   EOSABS 0.0 01/02/2013 0951   BASOSABS 0.0 01/02/2013 0951     BNP    Component Value Date/Time   PROBNP 398.0* 07/15/2009 1240    Lipid Panel     Component Value Date/Time   CHOL 219* 02/22/2013 1006   TRIG 174.0* 02/22/2013 1006   HDL 64.20 02/22/2013  1006   CHOLHDL 3 02/22/2013 1006   VLDL 34.8 02/22/2013 1006     RADIOLOGY: No results found.    ASSESSMENT AND PLAN: Ms. Mezo is now 79 years old she developed complete heart block on 07/15/2009 when she presented with a ventricular rate in the 30s. At that time I placed a temporary pacemaker and on January 3 she underwent permanent pacemaker insertion with a St. Jude's dual-chamber pacemaker. She is having tach pacemaker function when assessed by Dr. Eugene Gavia. She had a normal perfusion study in for her 2011. She does have aortic sclerosis and documented diastolic dysfunction with normal systolic function. She does have dyspeptic symptoms. I suggested a trial of over-the-counter Prilosec or Zantac/Pepcid she does have history of a bulging disc at the level of L2 to 3/34 leading to some sciatica discomfort. I am checking laboratory in the fasting state her current medical regimen adjustments will be made accordingly. She will have a followup pacemaker evaluation with Dr. Raina Mina, and in several months. I will see her in one year for followup cardiology evaluation.     Lennette Bihari, MD, [facility]  05/04/2013 11:00 AM

## 2013-05-04 NOTE — Patient Instructions (Signed)
Your physician recommends that you schedule a follow-up appointment in: 1 YEAR.  Your physician has recommended you make the following change in your medication:  Dr. Tresa Endo doesn't feel the symptoms are cardiac in origin. He recommend for you to take some over the counter medication such as prilosec or zantac or any type of medication in this class of drugs.

## 2013-06-18 ENCOUNTER — Telehealth: Payer: Self-pay | Admitting: *Deleted

## 2013-06-18 NOTE — Telephone Encounter (Signed)
Pt has a question about her last visit. Can someone please call her back.

## 2013-06-18 NOTE — Telephone Encounter (Signed)
Spoke to patient she states she received a letter stating that it was time have pacemaker checked in dec 2014  Connected   -Krista Ray to schedule appointment-she picked up the call.

## 2013-07-18 ENCOUNTER — Encounter: Payer: Self-pay | Admitting: *Deleted

## 2013-07-19 ENCOUNTER — Ambulatory Visit (INDEPENDENT_AMBULATORY_CARE_PROVIDER_SITE_OTHER): Payer: Medicare Other | Admitting: *Deleted

## 2013-07-19 DIAGNOSIS — I442 Atrioventricular block, complete: Secondary | ICD-10-CM

## 2013-07-19 LAB — MDC_IDC_ENUM_SESS_TYPE_INCLINIC
Brady Statistic RV Percent Paced: 0.1 %
Implantable Pulse Generator Model: 2110
Implantable Pulse Generator Serial Number: 7098440
Lead Channel Impedance Value: 562.5 Ohm
Lead Channel Pacing Threshold Amplitude: 0.5 V
Lead Channel Pacing Threshold Amplitude: 0.625 V
Lead Channel Pacing Threshold Pulse Width: 0.4 ms
Lead Channel Pacing Threshold Pulse Width: 0.4 ms
Lead Channel Sensing Intrinsic Amplitude: 10.3 mV
Lead Channel Sensing Intrinsic Amplitude: 2.1 mV
Lead Channel Setting Pacing Pulse Width: 0.4 ms
Lead Channel Setting Sensing Sensitivity: 2.5 mV
MDC IDC MSMT BATTERY VOLTAGE: 2.95 V
MDC IDC MSMT LEADCHNL RA IMPEDANCE VALUE: 387.5 Ohm
MDC IDC SESS DTM: 20150105150547
MDC IDC SET LEADCHNL RA PACING AMPLITUDE: 1.5 V
MDC IDC SET LEADCHNL RV PACING AMPLITUDE: 0.875
MDC IDC STAT BRADY RA PERCENT PACED: 23 %

## 2013-07-19 LAB — PACEMAKER DEVICE OBSERVATION

## 2013-07-19 NOTE — Patient Instructions (Signed)
Your physician recommends that you schedule a follow-up appointment on Monday, October 18, 2013 @ 9:00am with [facility] clinic [facility].

## 2013-07-21 NOTE — Progress Notes (Signed)
Pacemaker check in clinic. Normal device function. Thresholds, sensing, impedances consistent with previous measurements. Device programmed to maximize longevity. No mode switch or high ventricular rates noted. Device programmed at appropriate safety margins. Histogram distribution appropriate for patient activity level. Device programmed to optimize intrinsic conduction. Estimated longevity 8-9.4 years. Patient will follow up with the device clinic in 3 months.

## 2013-08-10 ENCOUNTER — Other Ambulatory Visit: Payer: Self-pay

## 2013-08-10 DIAGNOSIS — Z1231 Encounter for screening mammogram for malignant neoplasm of breast: Secondary | ICD-10-CM

## 2013-08-30 ENCOUNTER — Ambulatory Visit: Payer: Medicare Other | Admitting: Internal Medicine

## 2013-09-20 ENCOUNTER — Ambulatory Visit
Admission: RE | Admit: 2013-09-20 | Discharge: 2013-09-20 | Disposition: A | Discharge status: Home / Self Care | Payer: Medicare Other | Source: Ambulatory Visit

## 2013-09-20 DIAGNOSIS — Z1231 Encounter for screening mammogram for malignant neoplasm of breast: Secondary | ICD-10-CM

## 2013-10-18 ENCOUNTER — Ambulatory Visit (INDEPENDENT_AMBULATORY_CARE_PROVIDER_SITE_OTHER): Payer: Medicare Other | Admitting: *Deleted

## 2013-10-18 DIAGNOSIS — I442 Atrioventricular block, complete: Secondary | ICD-10-CM

## 2013-10-18 LAB — MDC_IDC_ENUM_SESS_TYPE_INCLINIC
Battery Remaining Longevity: 111.6 mo
Battery Voltage: 2.95 V
Brady Statistic RA Percent Paced: 37 %
Date Time Interrogation Session: 20150406085016
Lead Channel Impedance Value: 375 Ohm
Lead Channel Pacing Threshold Amplitude: 0.5 V
Lead Channel Pacing Threshold Amplitude: 0.625 V
Lead Channel Pacing Threshold Pulse Width: 0.4 ms
Lead Channel Sensing Intrinsic Amplitude: 2.6 mV
Lead Channel Setting Pacing Amplitude: 0.875
Lead Channel Setting Pacing Pulse Width: 0.4 ms
MDC IDC MSMT LEADCHNL RV IMPEDANCE VALUE: 550 Ohm
MDC IDC MSMT LEADCHNL RV PACING THRESHOLD PULSEWIDTH: 0.4 ms
MDC IDC MSMT LEADCHNL RV SENSING INTR AMPL: 10.3 mV
MDC IDC PG SERIAL: 7098440
MDC IDC SET LEADCHNL RA PACING AMPLITUDE: 1.5 V
MDC IDC SET LEADCHNL RV SENSING SENSITIVITY: 2.5 mV
MDC IDC STAT BRADY RV PERCENT PACED: 0.26 %

## 2013-10-18 LAB — PACEMAKER DEVICE OBSERVATION

## 2013-10-18 NOTE — Progress Notes (Signed)
Pacemaker check in clinic. Normal device function. Thresholds, sensing, impedances consistent with previous measurements. Device programmed to maximize longevity. No mode switch or high ventricular rates noted. Device programmed at appropriate safety margins. Histogram distribution appropriate for patient activity level. Device programmed to optimize intrinsic conduction. Estimated longevity 8.7 years.  Patient education completed.  ROV 3 months with Dr. Royann Shiversroitoru.

## 2013-12-27 ENCOUNTER — Encounter: Payer: Self-pay | Admitting: *Deleted

## 2014-01-12 ENCOUNTER — Ambulatory Visit (INDEPENDENT_AMBULATORY_CARE_PROVIDER_SITE_OTHER): Payer: Medicare Other | Admitting: *Deleted

## 2014-01-12 DIAGNOSIS — I442 Atrioventricular block, complete: Secondary | ICD-10-CM

## 2014-01-12 LAB — MDC_IDC_ENUM_SESS_TYPE_INCLINIC
Battery Remaining Longevity: 133.2 mo
Implantable Pulse Generator Model: 2110
Implantable Pulse Generator Serial Number: 7098440
Lead Channel Impedance Value: 525 Ohm
Lead Channel Pacing Threshold Amplitude: 0.5 V
Lead Channel Pacing Threshold Amplitude: 0.625 V
Lead Channel Pacing Threshold Pulse Width: 0.4 ms
Lead Channel Pacing Threshold Pulse Width: 0.4 ms
Lead Channel Sensing Intrinsic Amplitude: 10.3 mV
Lead Channel Setting Pacing Amplitude: 0.875
Lead Channel Setting Pacing Pulse Width: 0.4 ms
Lead Channel Setting Sensing Sensitivity: 2.5 mV
MDC IDC MSMT BATTERY VOLTAGE: 2.95 V
MDC IDC MSMT LEADCHNL RA IMPEDANCE VALUE: 387.5 Ohm
MDC IDC MSMT LEADCHNL RA SENSING INTR AMPL: 3.1 mV
MDC IDC SESS DTM: 20150701094402
MDC IDC SET LEADCHNL RA PACING AMPLITUDE: 1.5 V
MDC IDC STAT BRADY RA PERCENT PACED: 36 %
MDC IDC STAT BRADY RV PERCENT PACED: 1.7 %

## 2014-01-12 NOTE — Progress Notes (Signed)
Pacemaker check in clinic. Normal device function. Thresholds, sensing, impedances consistent with previous measurements. Device programmed to maximize longevity. No mode switch or high ventricular rates noted. Device programmed at appropriate safety margins. Histogram distribution appropriate for patient activity level. Device programmed to optimize intrinsic conduction. Estimated longevity 10.6 years.  Patient education completed.  ROV in October with Dr. Royann Shiversroitoru.

## 2014-01-18 ENCOUNTER — Ambulatory Visit: Payer: Medicare Other | Admitting: Internal Medicine

## 2014-02-07 ENCOUNTER — Encounter: Payer: Self-pay | Admitting: Cardiovascular Disease

## 2014-02-16 IMAGING — CT CT L SPINE W/O CM
4 of 10 series · 12 of 33 positions shown, 14 images · non-contrast
Comparison: Lumbar MRI 06/24/2003.

CLINICAL DATA: Low back pain with numbness and weakness in the
hands and legs after falling 2 weeks ago.  History of back surgery
in 9771.  Pacemaker.

CT LUMBAR SPINE WITHOUT CONTRAST
TECHNIQUE: Multidetector CT imaging of the lumbar spine was
performed without intravenous contrast administration. Multiplanar
CT image reconstructions were also generated.

[Series 4: l spine bone · axial · 0.27mm/px · z∈[+39,+104]mm · 2 of 80 slices shown, 3 images]
[im 27/80  soft-tissue]
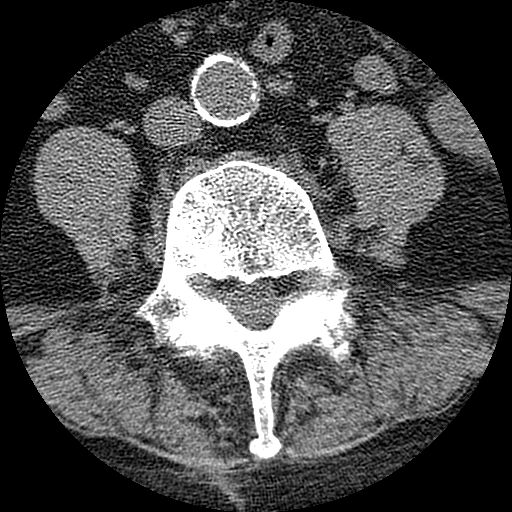
[im 27/80  bone]
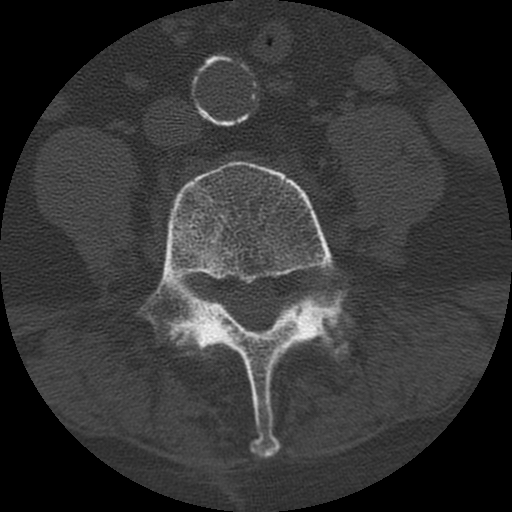
[im 53/80  bone]
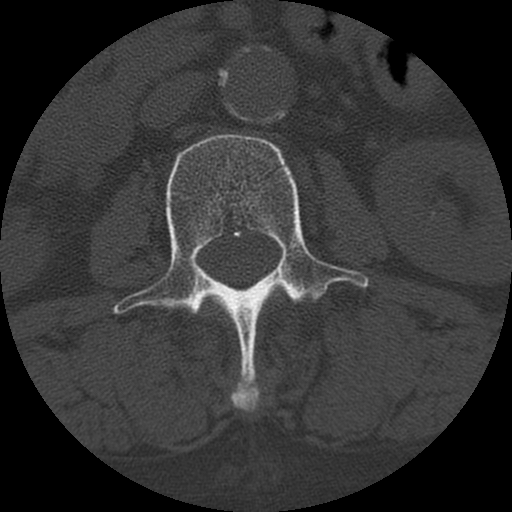

[Series 5: l spine detail · axial · 0.27mm/px · z∈[+39,+104]mm · 2 of 80 slices shown]
[im 27/80  bone]
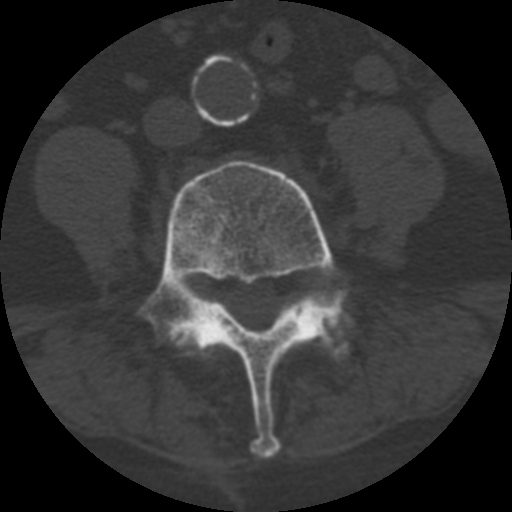
[im 53/80  bone]
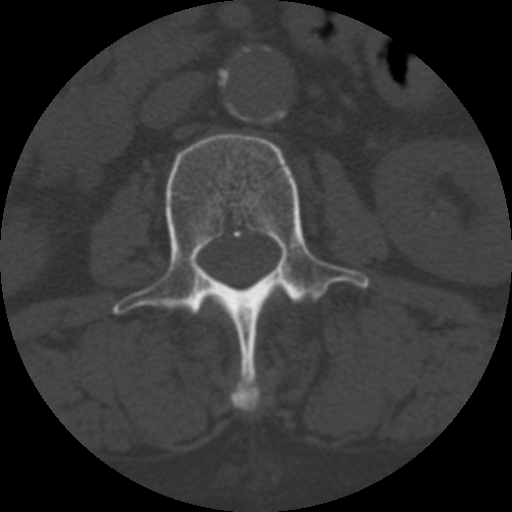

[Series 200: coronal · coronal · 0.40mm/px · 3 of 50 slices shown]
[im 10/50  bone]
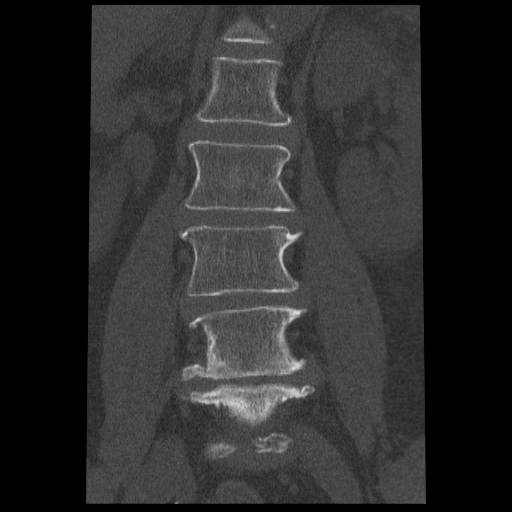
[im 20/50  bone]
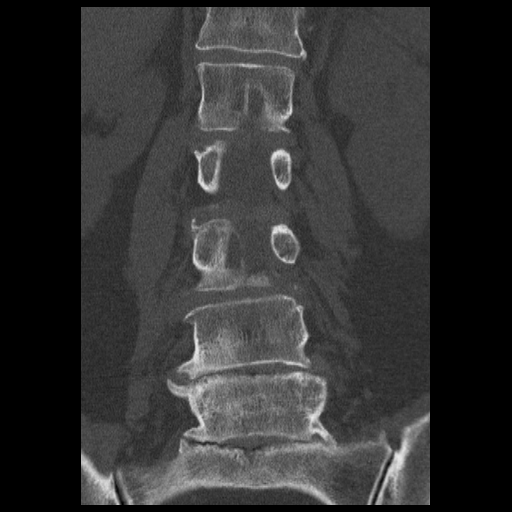
[im 30/50  bone]
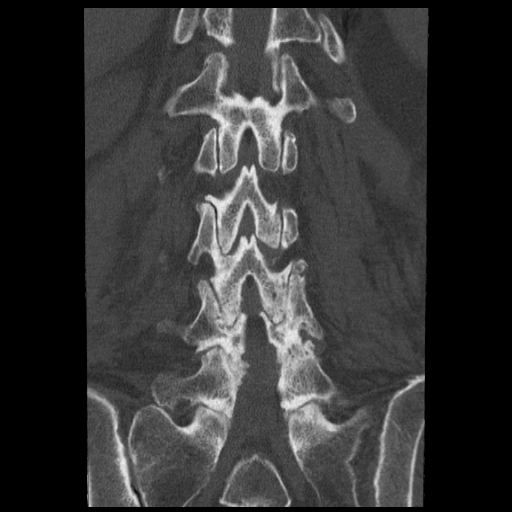

[Series 201: sagittal · sagittal · 0.40mm/px · 5 of 40 slices shown, 6 images]
[im 14/40  bone]
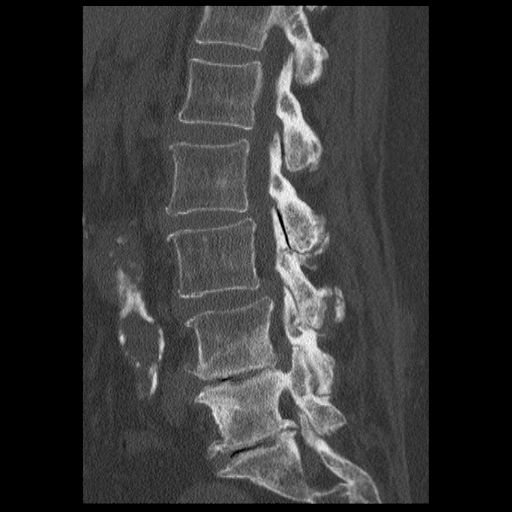
[im 17/40  bone]
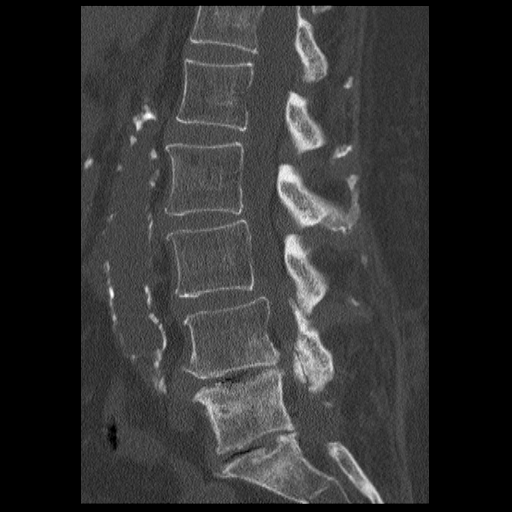
[im 20/40  soft-tissue]
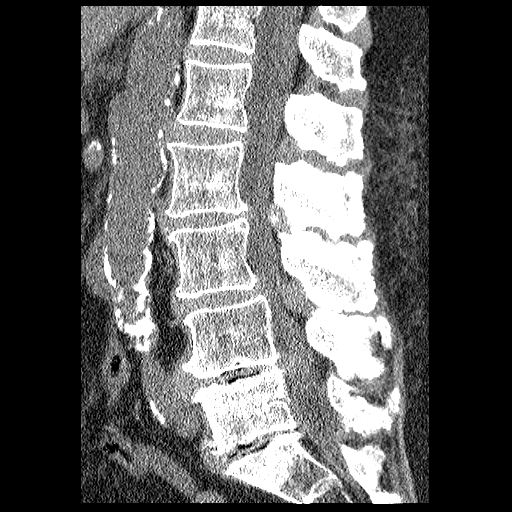
[im 20/40  bone]
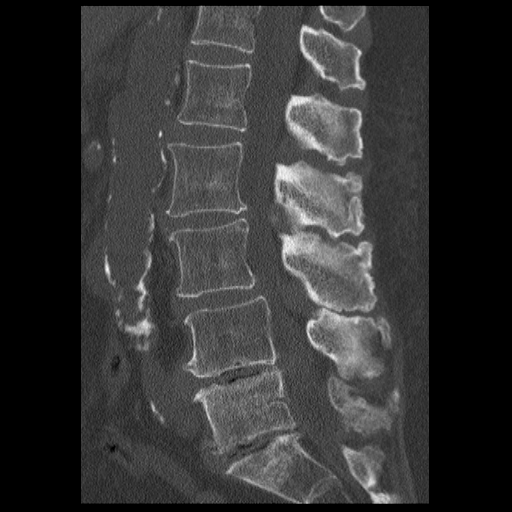
[im 23/40  bone]
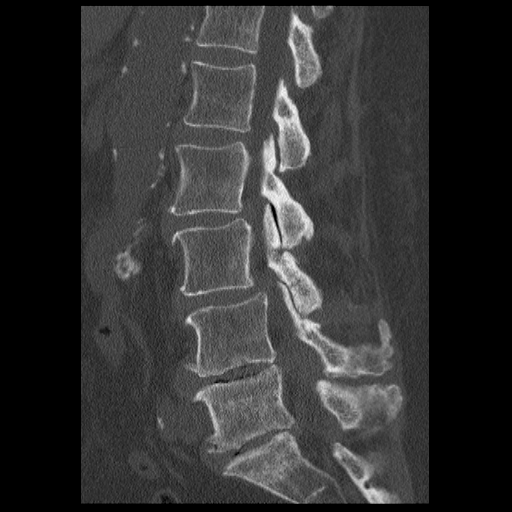
[im 27/40  bone]
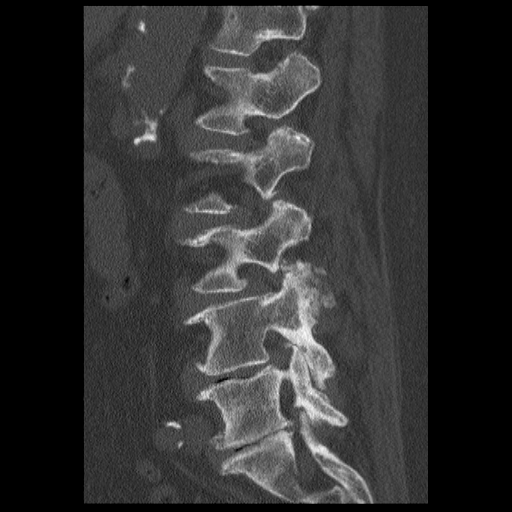

[12 of 33 positions shown; findings below may reference images not displayed]

FINDINGS: There is a stable grade 1 degenerative anterolisthesis at
L3-L4.  There is progressive disc degeneration, loss of disc height
and endplate sclerosis at the L4-L5 and L5-S1 levels.  There is no
evidence of acute fracture or pars defect.

A pacemaker and diffuse aorto iliac atherosclerosis are noted.
There are no acute paraspinal abnormalities.

L1-L2:  Minimal disc bulging.  No spinal stenosis or nerve root
encroachment.

L2-L3:  There is progressive annular disc bulging, facet and
ligamentous hypertrophy contributing to mild to moderate central
and mild lateral recess stenosis bilaterally.  The foramina appear
sufficiently patent.

L3-L4:  There is progressive disc degeneration with underlying
moderate facet and ligamentous hypertrophy.  There is progressive
spinal stenosis, now severe.  Both foramina are mildly narrowed,
worse on the left.

L4-L5:  Interval right laminectomy with adequate decompression of
the spinal canal.  There is progressive disc degeneration with
annular bulging and osteophytes.  The right foraminal stenosis
appears progressive, primarily secondary to osteophytes.  Mild left
foraminal stenosis appears stable.

L5-S1:  Interval right laminectomy with adequate decompression of
the spinal canal.  There is progressive disc degeneration with
posterior osteophytes and bilateral facet hypertrophy. Mild
foraminal narrowing is present bilaterally.
IMPRESSION: 1.  Since the 5332 examination, the patient has undergone interval
right laminectomy at the L4-L5 and L5-S1 levels.  There is
progressive disc degeneration at both levels.  The foramina are
mildly narrowed at the operative levels, especially on the right at
L4-L5.
2.  Progressive multifactorial spinal stenosis at L3-L4, now
severe.

3.  Mildly progressive spinal stenosis at L2-L3, now mild to
moderate.
4.  No acute osseous findings or significant change in alignment.

## 2014-04-29 ENCOUNTER — Ambulatory Visit (INDEPENDENT_AMBULATORY_CARE_PROVIDER_SITE_OTHER): Payer: Medicare Other | Admitting: Cardiovascular Disease

## 2014-04-29 ENCOUNTER — Encounter: Payer: Self-pay | Admitting: Cardiovascular Disease

## 2014-04-29 VITALS — BP 130/70 | HR 61 | Ht 61.0 in | Wt 134.0 lb

## 2014-04-29 DIAGNOSIS — Z950 Presence of cardiac pacemaker: Secondary | ICD-10-CM

## 2014-04-29 DIAGNOSIS — R1013 Epigastric pain: Secondary | ICD-10-CM

## 2014-04-29 DIAGNOSIS — E119 Type 2 diabetes mellitus without complications: Secondary | ICD-10-CM

## 2014-04-29 DIAGNOSIS — E782 Mixed hyperlipidemia: Secondary | ICD-10-CM

## 2014-04-29 DIAGNOSIS — M4726 Other spondylosis with radiculopathy, lumbar region: Secondary | ICD-10-CM

## 2014-04-29 DIAGNOSIS — E785 Hyperlipidemia, unspecified: Secondary | ICD-10-CM

## 2014-04-29 DIAGNOSIS — I10 Essential (primary) hypertension: Secondary | ICD-10-CM

## 2014-04-29 MED ORDER — HYDROCHLOROTHIAZIDE 12.5 MG PO CAPS: ORAL_CAPSULE | ORAL | Status: DC

## 2014-04-29 NOTE — Patient Instructions (Addendum)
Your physician has recommended you make the following change in your medication: start new prescription for fluid pill as directed on the bottle.  Initially take 1 tablet twice a week. Once swelling has gone take only as needed.  Your physician wants you to follow-up in: 1 year or sooner if needed. You will receive a reminder letter in the mail two months in advance. If you don't receive a letter, please call our office to schedule the follow-up appointment.  Your physician recommends that you return for lab work fasting.

## 2014-04-29 NOTE — Progress Notes (Signed)
Patient ID: Krista Ray, female   DOB: [birth year], 80 y.o.   MRN: [id number]     HPI: Krista Ray is a 80 y.o. female who presents to the office today for a two-month followup cardiology evaluation.  Krista Ray is a very pleasant 80 year old female who developed third degree heart block and underwent permanent pacemaker insertion on 07/17/2009 with a St. Jude's dual-chamber pacemaker. Additional problems include hypertension with grade 1 diastolic dysfunction, hyperlipidemia, aortic valve sclerosis, mild to moderate tricuspid and mitral regurgitation documented on echo Doppler study in 2011 as well as osteoarthritis and degenerative lumbar spine disease.  Remotely, a nuclear perfusion study in 2011 showed normal perfusion. Dr. Ladona Ridgel had placed her pacemaker in 2011 and Dr. Rubie Maid has been following her pacemaker.  Krista Ray tells her pacemaker was last interrogated approximately 3 months ago, and Krista Ray had intact pacemaker function.  I did review the pacemaker clinic evaluation from July 2015.  Estimated longevity is 10.6 years.  Krista Ray has a return office visit in October to see Dr. Royann Shivers.  Presently, Krista Ray admits to experiencing some ankle edema, almost daily.  Krista Ray uses support stockings.  Krista Ray walks with a cane.  Krista Ray denies exertional chest pain. Krista Ray denies dyspnea. Krista Ray denies syncope.  Krista Ray tells me Krista Ray was diagnosed with mild diabetes.  Over the past year and was started on low-dose metformin.  Krista Ray is also concerned about possible thyroid issues.  Krista Ray presents for evaluation.   Past Medical History  Diagnosis Date  . Presence of permanent cardiac pacemaker 07/17/2009    St.Jude  . Coronary artery disease   . MI (myocardial infarction)   . Hypertension   . Hyperlipemia   . Chronic back pain   . DDD (degenerative disc disease), lumbar   . DDD (degenerative disc disease), cervical   . Chronic neck pain   . Radiculopathy   . Sciatic nerve pain     Past Surgical History  Procedure  Laterality Date  . Pacemaker insertion  07/17/2009    left side-St.Jude  . Appendectomy  1944  . Abdominal hysterectomy  1956  . Cataract extraction Right   . Back surgery  2004  . US echocardiography  11/28/2011    Normal  . Nm myocar perf wall motion  09/06/2009    Normal    Allergies  Allergen Reactions  . Lovastatin   . Penicillins     Unknown been years ago  . Pravastatin Sodium     Current Outpatient Prescriptions  Medication Sig Dispense Refill  . amLODipine (NORVASC) 5 MG tablet Take 5 mg by mouth daily.        . Aspirin (ASPIR-81 PO) Take 162 mg by mouth once.       . Calcium Carbonate-Vitamin D (CALTRATE 600+D) 600-400 MG-UNIT per tablet Take 1 tablet by mouth 2 (two) times daily.        Marland Kitchen ezetimibe (ZETIA) 10 MG tablet Take 10 mg by mouth daily.      . hydrocortisone cream 0.5 % Apply topically as needed.      . metFORMIN (GLUCOPHAGE) 500 MG tablet Take 250 mg by mouth daily.      . metoprolol succinate (TOPROL-XL) 25 MG 24 hr tablet Take 25 mg by mouth daily.      . Multiple Vitamins-Minerals (CENTRUM SILVER ULTRA WOMENS PO) Take by mouth daily.      . ondansetron (ZOFRAN) 4 MG tablet Take 1 tablet (4 mg total) by mouth every 8 (eight) hours as  needed for nausea.  6 tablet  0   No current facility-administered medications for this visit.    History   Social History  . Marital Status: Widowed    Spouse Name: N/A    Number of Children: N/A  . Years of Education: N/A   Occupational History  . Not on file.   Social History Main Topics  . Smoking status: Never Smoker   . Smokeless tobacco: Not on file  . Alcohol Use: No  . Drug Use: No  . Sexual Activity: No   Other Topics Concern  . Not on file   Social History Narrative  . No narrative on file    Family History  Problem Relation Age of Onset  . Heart disease Father   . Heart disease Sister   . Cancer Brother   . Diabetes Neg Hx   . Hypertension Father   . Stroke Father    ROS General:  Negative; No fevers, chills, or night sweats;  HEENT: Negative; No changes in vision or hearing, sinus congestion, difficulty swallowing Pulmonary: Negative; No cough, wheezing, shortness of breath, hemoptysis Cardiovascular: Negative; No chest pain, presyncope, syncope, palpitations Positive for ankle swelling GI: Negative; No nausea, vomiting, diarrhea, or abdominal pain GU: Negative; No dysuria, hematuria, or difficulty voiding Musculoskeletal: Negative; no myalgias, joint pain, or weakness Hematologic/Oncology: Negative; no easy bruising, bleeding Endocrine: Positive for diabetes, diagnosed this past  year; no heat/cold intolerance;  Neuro: Positive for issues with sciatica; no changes in balance, headaches Skin: Negative; No rashes or skin lesions Psychiatric: Negative; No behavioral problems, depression Sleep: Negative; No snoring, daytime sleepiness, hypersomnolence, bruxism, restless legs, hypnogognic hallucinations, no cataplexy Other comprehensive 14 point system review is negative.   PE There were no vitals taken for this visit.  General: Alert, oriented, no distress.  Skin: normal turgor, no rashes HEENT: Normocephalic, atraumatic. Pupils round and reactive; sclera anicteric;no lid lag.  Nose without nasal septal hypertrophy Mouth/Parynx benign; Mallinpatti scale 3 Neck: No JVD, no carotid bruits with normal carotid upstroke Lungs: clear to ausculatation and percussion; no wheezing or rales Chest wall: Nontender to palpation Heart: RRR, s1 s2 normal 1-2/6 systolic murmur; no diastolic murmur.  No S3 or S4 gallop Abdomen: soft, nontender; no hepatosplenomehaly, BS+; abdominal aorta nontender and not dilated by palpation. Back: No CVA tenderness Pulses 2+ Extremities: no clubbing cyanosis or edema, Homan's sign negative  Neurologic: grossly nonfocal Psychologic: normal affect and mood.  ECG (independently read by me): 100% atrially paced with ventricular sensing.  Heart  rate 61 beats per minute.  Right bundle branch block.  Prior ECG: Normal sinus rhythm with incomplete right bundle branch block. PR interval 128 ms. Most of the time Krista Ray appears to be atrial and ventricular sensing but Krista Ray did have several atrial paced beats.  LABS:  BMET    Component Value Date/Time   NA 143 02/22/2013 1006   K 4.9 02/22/2013 1006   CL 104 02/22/2013 1006   CO2 28 02/22/2013 1006   GLUCOSE 98 02/22/2013 1006   BUN 21 02/22/2013 1006   CREATININE 0.9 02/22/2013 1006   CALCIUM 10.7* 02/22/2013 1006   GFRNONAA 55* 01/02/2013 0951   GFRAA 64* 01/02/2013 0951     Hepatic Function Panel     Component Value Date/Time   PROT 7.8 02/22/2013 1006   ALBUMIN 3.9 02/22/2013 1006   AST 22 02/22/2013 1006   ALT 21 02/22/2013 1006   ALKPHOS 62 02/22/2013 1006   BILITOT 0.7 02/22/2013 1006  BILIDIR 0.2 07/15/2009 1240   IBILI 0.4 07/15/2009 1240     CBC    Component Value Date/Time   WBC 6.6 02/22/2013 1006   RBC 4.93 02/22/2013 1006   HGB 15.4* 02/22/2013 1006   HCT 46.2* 02/22/2013 1006   PLT 247.0 02/22/2013 1006   MCV 93.8 02/22/2013 1006   MCH 31.3 01/02/2013 0951   MCHC 33.4 02/22/2013 1006   RDW 13.8 02/22/2013 1006   LYMPHSABS 0.8 01/02/2013 0951   MONOABS 0.4 01/02/2013 0951   EOSABS 0.0 01/02/2013 0951   BASOSABS 0.0 01/02/2013 0951     BNP    Component Value Date/Time   PROBNP 398.0* 07/15/2009 1240    Lipid Panel     Component Value Date/Time   CHOL 219* 02/22/2013 1006   TRIG 174.0* 02/22/2013 1006   HDL 64.20 02/22/2013 1006   CHOLHDL 3 02/22/2013 1006   VLDL 34.8 02/22/2013 1006     RADIOLOGY: No results found.    ASSESSMENT AND PLAN: Krista Ray  developed complete heart block on 07/15/2009 when Krista Ray presented with a ventricular rate in the 30s. At that time I placed a temporary pacemaker and on July 17 2009 Krista Ray underwent permanent pacemaker insertion with a St. Jude's dual-chamber pacemaker.  Krista Ray has normal pacemaker device function.  Her recent thresholds,  sensing, impedances are consistent with previous measurements.  Krista Ray's not having any chest pain.  Her blood pressure today is stable.  Krista Ray does have ankle edema bilaterally.  I have recommended the addition of HCTZ 12.5 mg to her medical regimen to initiate this twice a week and if Krista Ray continues to note swelling to then try taking this every other day.  I will check a complete set of blood work in the fasting state, including CBC compressive metabolic panel, hemoglobin A1c, TSH level, as well as lipid studies.  Krista Ray has been on metformin  250 mg daily for her recently diagnosed diabetes.  Krista Ray also is on Zetia 10 mg for mild hyperlipidemia.  I suspect some of her peripheral edema may be contributed by her amlodipine.  Her blood pressure today is stable and on recheck by me was 124/64.  Krista Ray will undergo a followup pacemaker evaluation with Dr.Croitoru and I will see her back in one year for reevaluation or sooner if problems arise.   Lennette Biharihomas A. Kamica Florance, MD, [facility]  04/29/2014 8:00 AM

## 2014-05-02 LAB — COMPREHENSIVE METABOLIC PANEL
ALT: 19 U/L (ref 0–35)
AST: 23 U/L (ref 0–37)
Albumin: 4.1 g/dL (ref 3.5–5.2)
Alkaline Phosphatase: 71 U/L (ref 39–117)
BUN: 14 mg/dL (ref 6–23)
CO2: 30 mEq/L (ref 19–32)
Calcium: 9.6 mg/dL (ref 8.4–10.5)
Chloride: 102 mEq/L (ref 96–112)
Creat: 0.84 mg/dL (ref 0.50–1.10)
Glucose, Bld: 99 mg/dL (ref 70–99)
Potassium: 4.8 mEq/L (ref 3.5–5.3)
SODIUM: 139 meq/L (ref 135–145)
TOTAL PROTEIN: 7 g/dL (ref 6.0–8.3)
Total Bilirubin: 0.8 mg/dL (ref 0.2–1.2)

## 2014-05-02 LAB — LIPID PANEL
CHOL/HDL RATIO: 2.6 ratio
Cholesterol: 182 mg/dL (ref 0–200)
HDL: 71 mg/dL (ref >39)
LDL CALC: 91 mg/dL (ref 0–99)
Triglycerides: 99 mg/dL (ref <150)
VLDL: 20 mg/dL (ref 0–40)

## 2014-05-02 LAB — HEMOGLOBIN A1C
Hgb A1c MFr Bld: 6.1 % — ABNORMAL HIGH (ref <5.7)
MEAN PLASMA GLUCOSE: 128 mg/dL — AB (ref <117)

## 2014-05-02 LAB — CBC
HCT: 44.1 % (ref 36.0–46.0)
Hemoglobin: 14.8 g/dL (ref 12.0–15.0)
MCH: 31 pg (ref 26.0–34.0)
MCHC: 33.6 g/dL (ref 30.0–36.0)
MCV: 92.3 fL (ref 78.0–100.0)
PLATELETS: 238 10*3/uL (ref 150–400)
RBC: 4.78 MIL/uL (ref 3.87–5.11)
RDW: 13.9 % (ref 11.5–15.5)
WBC: 4.4 10*3/uL (ref 4.0–10.5)

## 2014-05-02 LAB — TSH: TSH: 1.521 u[IU]/mL (ref 0.350–4.500)

## 2014-05-09 ENCOUNTER — Encounter: Payer: Self-pay | Admitting: *Deleted

## 2014-05-10 ENCOUNTER — Encounter: Payer: Self-pay | Admitting: Cardiovascular Disease

## 2014-05-10 ENCOUNTER — Ambulatory Visit (INDEPENDENT_AMBULATORY_CARE_PROVIDER_SITE_OTHER): Payer: Medicare Other | Admitting: Cardiovascular Disease

## 2014-05-10 VITALS — BP 139/66 | HR 63 | Resp 16 | Ht 61.0 in | Wt 132.7 lb

## 2014-05-10 DIAGNOSIS — I442 Atrioventricular block, complete: Secondary | ICD-10-CM

## 2014-05-10 DIAGNOSIS — Z950 Presence of cardiac pacemaker: Secondary | ICD-10-CM

## 2014-05-10 LAB — MDC_IDC_ENUM_SESS_TYPE_INCLINIC
Battery Voltage: 2.93 V
Date Time Interrogation Session: 20151027091015
Implantable Pulse Generator Model: 2110
Implantable Pulse Generator Serial Number: 7098440
Lead Channel Impedance Value: 512.5 Ohm
Lead Channel Pacing Threshold Amplitude: 0.5 V
Lead Channel Pacing Threshold Amplitude: 0.625 V
Lead Channel Pacing Threshold Pulse Width: 0.4 ms
Lead Channel Pacing Threshold Pulse Width: 0.4 ms
Lead Channel Setting Pacing Amplitude: 0.875
Lead Channel Setting Pacing Amplitude: 1.5 V
MDC IDC MSMT BATTERY REMAINING LONGEVITY: 117.6 mo
MDC IDC MSMT LEADCHNL RA IMPEDANCE VALUE: 375 Ohm
MDC IDC MSMT LEADCHNL RA SENSING INTR AMPL: 1.5 mV
MDC IDC MSMT LEADCHNL RV SENSING INTR AMPL: 9.9 mV
MDC IDC SET LEADCHNL RV PACING PULSEWIDTH: 0.4 ms
MDC IDC SET LEADCHNL RV SENSING SENSITIVITY: 2.5 mV
MDC IDC STAT BRADY RA PERCENT PACED: 36 %
MDC IDC STAT BRADY RV PERCENT PACED: 2.2 %

## 2014-05-10 NOTE — Patient Instructions (Signed)
Your physician recommends that you schedule a follow-up appointment in: 6 months with Dr.Croitoru + pacemaker check   

## 2014-05-13 ENCOUNTER — Encounter: Payer: Self-pay | Admitting: Cardiovascular Disease

## 2014-05-13 NOTE — Progress Notes (Signed)
Patient ID: Krista Ray, female   DOB: 08/24/1922, 80 y.o.   MRN: [id number]     Reason for office visit Pacemaker follow-up  [name] is an 80 year old woman with a history of paroxysmal third degree AV block and received a dual-chamber permanent pacemaker (St. Jude) and generates 2011. She has systemic hypertension, mild diastolic left ventricular dysfunction, aortic valve sclerosis, mild to moderate tricuspid and mitral insufficiency and hyperlipidemia that is unfortunately intolerant to statins (has tried lovastatin and simvastatin in the past in (. She is currently receiving sodium on her therapy.  She is here for pacemaker interrogation. Her device is functioning normally. Generator longevity is estimated to be about 9.5 years. Chest 36% atrial pacing and only 2.3% ventricular pacing. She is not technically pacemaker dependent, but has had prolonged pauses related to third degree AV block prior to device implantation.   She just saw Dr. Nicki Guadalajarahomas Kelly on October 16   Allergies  Allergen Reactions  . Lovastatin   . Penicillins     Unknown been years ago  . Pravastatin Sodium     Current Outpatient Prescriptions  Medication Sig Dispense Refill  . amLODipine (NORVASC) 5 MG tablet Take 5 mg by mouth daily.        . Aspirin (ASPIR-81 PO) Take 162 mg by mouth once.       . Calcium Carbonate-Vitamin D (CALTRATE 600+D) 600-400 MG-UNIT per tablet Take 1 tablet by mouth 2 (two) times daily.        Marland Kitchen. ezetimibe (ZETIA) 10 MG tablet Take 10 mg by mouth daily.      . hydrochlorothiazide (MICROZIDE) 12.5 MG capsule Take 1 capsule as needed for swelling.  30 capsule  3  . hydrocortisone cream 0.5 % Apply topically as needed.      . metFORMIN (GLUCOPHAGE) 500 MG tablet Take 250 mg by mouth daily.      . metoprolol succinate (TOPROL-XL) 25 MG 24 hr tablet Take 25 mg by mouth daily.      . Multiple Vitamins-Minerals (CENTRUM SILVER ULTRA WOMENS PO) Take by mouth daily.      . ondansetron  (ZOFRAN) 4 MG tablet Take 1 tablet (4 mg total) by mouth every 8 (eight) hours as needed for nausea.  6 tablet  0   No current facility-administered medications for this visit.    Past Medical History  Diagnosis Date  . Presence of permanent cardiac pacemaker 07/17/2009    St.Jude  . Coronary artery disease   . MI (myocardial infarction)   . Hypertension   . Hyperlipemia   . Chronic back pain   . DDD (degenerative disc disease), lumbar   . DDD (degenerative disc disease), cervical   . Chronic neck pain   . Radiculopathy   . Sciatic nerve pain     Past Surgical History  Procedure Laterality Date  . Pacemaker insertion  07/17/2009    left side-St.Jude  . Appendectomy  1944  . Abdominal hysterectomy  1956  . Cataract extraction Right   . Back surgery  2004  . [place] echocardiography  11/28/2011    Normal  . Nm myocar perf wall motion  09/06/2009    Normal    Family History  Problem Relation Age of Onset  . Heart disease Father   . Heart disease Sister   . Cancer Brother   . Diabetes Neg Hx   . Hypertension Father   . Stroke Father     History   Social History  .  Marital Status: Widowed    Spouse Name: Krista Ray    Number of Children: Krista Ray  . Years of Education: Krista Ray   Occupational History  . Not on file.   Social History Main Topics  . Smoking status: Never Smoker   . Smokeless tobacco: Not on file  . Alcohol Use: No  . Drug Use: No  . Sexual Activity: No   Other Topics Concern  . Not on file   Social History Narrative  . No narrative on file    Review of systems: Chronic bilateral ankle edema The patient specifically denies any chest pain at rest or with exertion, dyspnea at rest or with exertion, orthopnea, paroxysmal nocturnal dyspnea, syncope, palpitations, focal neurological deficits, intermittent claudication, unexplained weight gain, cough, hemoptysis or wheezing.  The patient also denies abdominal pain, nausea, vomiting, dysphagia, diarrhea, constipation,  polyuria, polydipsia, dysuria, hematuria, frequency, urgency, abnormal bleeding or bruising, fever, chills, unexpected weight changes, mood swings, change in skin or hair texture, change in voice quality, auditory or visual problems, allergic reactions or rashes, new musculoskeletal complaints other than usual "aches and pains".    PHYSICAL EXAM BP 139/66  Pulse 63  Resp 16  Ht 5\' 1"  (1.549 m)  Wt 132 lb 11.2 oz (60.192 kg)  BMI 25.09 kg/m2 General: Alert, oriented x3, no distress  Head: no evidence of trauma, PERRL, EOMI, no exophtalmos or lid lag, no myxedema, no xanthelasma; normal ears, nose and oropharynx  Neck: normal jugular venous pulsations and no hepatojugular reflux; brisk carotid pulses without delay and no carotid bruits  Chest: clear to auscultation, no signs of consolidation by percussion or palpation, normal fremitus, symmetrical and full respiratory excursions; healthy left subclavian pacemaker site  Cardiovascular: normal position and quality of the apical impulse, regular rhythm, normal first and second heart sounds, no murmurs, rubs or gallops  Abdomen: no tenderness or distention, no masses by palpation, no abnormal pulsatility or arterial bruits, normal bowel sounds, no hepatosplenomegaly  Extremities: no clubbing, cyanosis or edema; 2+ radial, ulnar and brachial pulses bilaterally; 2+ right femoral, posterior tibial and dorsalis pedis pulses; 2+ left femoral, posterior tibial and dorsalis pedis pulses; no subclavian or femoral bruits  Neurological: grossly nonfocal   EKG: Normal sinus rhythm, nonspecific intraventricular block most closely resembling 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   Lipid Panel     Component Value Date/Time   CHOL 182 05/02/2014 0803   TRIG 99 05/02/2014 0803   HDL 71 05/02/2014 0803   CHOLHDL 2.6 05/02/2014 0803   VLDL 20 05/02/2014 0803   LDLCALC 91 05/02/2014 0803   LDLDIRECT 137.0 02/22/2013 1006     BMET    Component Value Date/Time   NA 139 05/02/2014 0803   K 4.8 05/02/2014 0803   CL 102 05/02/2014 0803   CO2 30 05/02/2014 0803   GLUCOSE 99 05/02/2014 0803   BUN 14 05/02/2014 0803   CREATININE 0.84 05/02/2014 0803   CREATININE 0.9 02/22/2013 1006   CALCIUM 9.6 05/02/2014 0803   GFRNONAA 55* 01/02/2013 0951   GFRAA 64* 01/02/2013 0951     ASSESSMENT AND PLAN  Normal dual chamber permanent pacemaker function. No permanent reprogramming changes were necessary today. Follow-up in device clinic in 6 months. She does not want to do remote monitoring.  Orders Placed This Encounter  Procedures  . Implantable device check   Antonia Culbertson  Thurmon FairMihai Maxi Rodas, MD, [facility] [phone] office [phone] pager

## 2014-08-16 ENCOUNTER — Other Ambulatory Visit: Payer: Self-pay

## 2014-08-16 DIAGNOSIS — Z1231 Encounter for screening mammogram for malignant neoplasm of breast: Secondary | ICD-10-CM

## 2014-09-22 ENCOUNTER — Ambulatory Visit
Admission: RE | Admit: 2014-09-22 | Discharge: 2014-09-22 | Disposition: A | Discharge status: Home / Self Care | Payer: Medicare Other | Source: Ambulatory Visit

## 2014-09-22 DIAGNOSIS — Z1231 Encounter for screening mammogram for malignant neoplasm of breast: Secondary | ICD-10-CM | POA: Diagnosis not present

## 2014-10-03 DIAGNOSIS — I10 Essential (primary) hypertension: Secondary | ICD-10-CM | POA: Diagnosis not present

## 2014-10-04 DIAGNOSIS — N3000 Acute cystitis without hematuria: Secondary | ICD-10-CM | POA: Diagnosis not present

## 2014-10-06 DIAGNOSIS — R350 Frequency of micturition: Secondary | ICD-10-CM | POA: Diagnosis not present

## 2014-10-11 DIAGNOSIS — H26492 Other secondary cataract, left eye: Secondary | ICD-10-CM | POA: Diagnosis not present

## 2014-10-11 DIAGNOSIS — H409 Unspecified glaucoma: Secondary | ICD-10-CM | POA: Diagnosis not present

## 2014-10-11 DIAGNOSIS — H35033 Hypertensive retinopathy, bilateral: Secondary | ICD-10-CM | POA: Diagnosis not present

## 2014-10-11 DIAGNOSIS — H4011X1 Primary open-angle glaucoma, mild stage: Secondary | ICD-10-CM | POA: Diagnosis not present

## 2014-10-11 LAB — HM DIABETES EYE EXAM

## 2014-11-08 ENCOUNTER — Ambulatory Visit (INDEPENDENT_AMBULATORY_CARE_PROVIDER_SITE_OTHER): Payer: Medicare Other | Admitting: Cardiovascular Disease

## 2014-11-08 ENCOUNTER — Encounter: Payer: Self-pay | Admitting: Cardiovascular Disease

## 2014-11-08 VITALS — BP 120/70 | HR 60 | Resp 16 | Ht 61.0 in | Wt 125.9 lb

## 2014-11-08 DIAGNOSIS — I442 Atrioventricular block, complete: Secondary | ICD-10-CM

## 2014-11-08 DIAGNOSIS — I10 Essential (primary) hypertension: Secondary | ICD-10-CM

## 2014-11-08 DIAGNOSIS — Z950 Presence of cardiac pacemaker: Secondary | ICD-10-CM | POA: Diagnosis not present

## 2014-11-08 LAB — MDC_IDC_ENUM_SESS_TYPE_INCLINIC
Battery Remaining Percentage: 91 %
Battery Voltage: 2.95 V
Brady Statistic RV Percent Paced: 1.8 %
Implantable Pulse Generator Model: 2110
Implantable Pulse Generator Serial Number: 7098440
Lead Channel Impedance Value: 380 Ohm
Lead Channel Pacing Threshold Amplitude: 0.75 V
Lead Channel Pacing Threshold Pulse Width: 0.4 ms
Lead Channel Pacing Threshold Pulse Width: 0.4 ms
Lead Channel Sensing Intrinsic Amplitude: 10.8 mV
Lead Channel Setting Pacing Amplitude: 1.5 V
Lead Channel Setting Sensing Sensitivity: 2.5 mV
MDC IDC MSMT LEADCHNL RA PACING THRESHOLD AMPLITUDE: 0.5 V
MDC IDC MSMT LEADCHNL RA SENSING INTR AMPL: 2.5 mV
MDC IDC MSMT LEADCHNL RV IMPEDANCE VALUE: 490 Ohm
MDC IDC SET LEADCHNL RV PACING AMPLITUDE: 0.875
MDC IDC SET LEADCHNL RV PACING PULSEWIDTH: 0.4 ms
MDC IDC STAT BRADY RA PERCENT PACED: 29 %

## 2014-11-08 NOTE — Patient Instructions (Addendum)
Your physician has recommended you make the following change in your medication: DISCONTINUE LOSARTAN 25 MG  Your physician recommends that you schedule a follow-up appointment in: 6 months with Dr.Croitoru + pacemaker check
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
## 2014-11-28 DIAGNOSIS — E1165 Type 2 diabetes mellitus with hyperglycemia: Secondary | ICD-10-CM | POA: Diagnosis not present

## 2014-12-15 ENCOUNTER — Encounter: Payer: Self-pay | Admitting: Cardiovascular Disease

## 2014-12-27 DIAGNOSIS — R262 Difficulty in walking, not elsewhere classified: Secondary | ICD-10-CM | POA: Diagnosis not present

## 2014-12-27 DIAGNOSIS — E119 Type 2 diabetes mellitus without complications: Secondary | ICD-10-CM | POA: Diagnosis not present

## 2014-12-27 DIAGNOSIS — M19072 Primary osteoarthritis, left ankle and foot: Secondary | ICD-10-CM | POA: Diagnosis not present

## 2014-12-27 DIAGNOSIS — L602 Onychogryphosis: Secondary | ICD-10-CM | POA: Diagnosis not present

## 2014-12-30 DIAGNOSIS — M19072 Primary osteoarthritis, left ankle and foot: Secondary | ICD-10-CM | POA: Diagnosis not present

## 2014-12-30 DIAGNOSIS — R262 Difficulty in walking, not elsewhere classified: Secondary | ICD-10-CM | POA: Diagnosis not present

## 2015-01-10 ENCOUNTER — Ambulatory Visit (INDEPENDENT_AMBULATORY_CARE_PROVIDER_SITE_OTHER): Payer: Medicare Other | Admitting: Internal Medicine

## 2015-01-10 ENCOUNTER — Other Ambulatory Visit (INDEPENDENT_AMBULATORY_CARE_PROVIDER_SITE_OTHER): Payer: Medicare Other

## 2015-01-10 ENCOUNTER — Encounter: Payer: Self-pay | Admitting: Internal Medicine

## 2015-01-10 ENCOUNTER — Telehealth: Payer: Self-pay | Admitting: Internal Medicine

## 2015-01-10 ENCOUNTER — Other Ambulatory Visit: Payer: Self-pay | Admitting: Internal Medicine

## 2015-01-10 VITALS — BP 142/82 | HR 66 | Temp 98.3°F | Resp 12 | Ht 61.0 in | Wt 121.8 lb

## 2015-01-10 DIAGNOSIS — R634 Abnormal weight loss: Secondary | ICD-10-CM | POA: Insufficient documentation

## 2015-01-10 DIAGNOSIS — L989 Disorder of the skin and subcutaneous tissue, unspecified: Secondary | ICD-10-CM

## 2015-01-10 DIAGNOSIS — E119 Type 2 diabetes mellitus without complications: Secondary | ICD-10-CM | POA: Diagnosis not present

## 2015-01-10 DIAGNOSIS — L659 Nonscarring hair loss, unspecified: Secondary | ICD-10-CM

## 2015-01-10 DIAGNOSIS — D531 Other megaloblastic anemias, not elsewhere classified: Secondary | ICD-10-CM

## 2015-01-10 LAB — HEMOGLOBIN A1C: Hgb A1c MFr Bld: 5.8 % (ref 4.6–6.5)

## 2015-01-10 LAB — COMPREHENSIVE METABOLIC PANEL
ALT: 24 U/L (ref 0–35)
AST: 24 U/L (ref 0–37)
Albumin: 3.9 g/dL (ref 3.5–5.2)
Alkaline Phosphatase: 74 U/L (ref 39–117)
BUN: 19 mg/dL (ref 6–23)
CO2: 32 mEq/L (ref 19–32)
CREATININE: 0.84 mg/dL (ref 0.40–1.20)
Calcium: 10.1 mg/dL (ref 8.4–10.5)
Chloride: 105 mEq/L (ref 96–112)
GFR: 81.84 mL/min (ref >60.00)
Glucose, Bld: 97 mg/dL (ref 70–99)
Potassium: 4.4 mEq/L (ref 3.5–5.1)
Sodium: 143 mEq/L (ref 135–145)
TOTAL PROTEIN: 7.3 g/dL (ref 6.0–8.3)
Total Bilirubin: 0.7 mg/dL (ref 0.2–1.2)

## 2015-01-10 LAB — TSH: TSH: 0.86 u[IU]/mL (ref 0.35–4.50)

## 2015-01-10 LAB — FERRITIN: Ferritin: 64 ng/mL (ref 10.0–291.0)

## 2015-01-10 LAB — VITAMIN B12: VITAMIN B 12: 783 pg/mL (ref 211–911)

## 2015-01-10 NOTE — Assessment & Plan Note (Signed)
Patient is down about 20 pounds since last year. Unclear if this represents malignancy versus FTT. Talked to her about trying ensure or boost to add calories. Concerning for her not able to live alone especially since her cardiologist has concerns about her driving (she does still drive and came alone today). No family to talk to and not clear if she needs help at home. She is well-kempt at the visit and coherent. Seems to know her medicines. Will follow up closely and watch for further loss. Would be concerned about chasing malignancy too hard and need more information about her QOL presently whether she would be a candidate for screening. No symptoms to flag source although skin lesions on her scalp are concerning.

## 2015-01-10 NOTE — Progress Notes (Signed)
Pre visit review using our clinic review tool, if applicable. No additional management support is needed unless otherwise documented below in the visit note. 

## 2015-01-10 NOTE — Assessment & Plan Note (Signed)
Checking thyroid, iron levels, B12. Concern for nutritional deficiency. She is down almost 20 pounds from 1 year ago. See separate problem. Referral for dermatology for this as well as the dark skin lesions on her head.

## 2015-01-10 NOTE — Progress Notes (Signed)
   Subjective:    Patient ID: Krista ChiquitoErnestine E Lio, female    DOB: July 18, 1922, 81 y.o.   MRN: [id number]  HPI The patient is a 81 YO female who is coming in with several acute complaints. She has been seen here before but has been seeing another provider until several months ago. She has brought those records with her. First complaint is hair loss. She is getting several bald spots and wants to know why. Also some weight loss in the last year with some decrease in hunger. She still eats 3 meals a day but not large portions and her niece thinks that she is not eating enough. She does live alone and has a nephew in town but no one helps her out at all. Denies problems with this arrangement.   The next complaint are some skin lesion on her head that have been present for some time. They have grown in the past year. They are near where she is losing some hair on her scalp. Has happened once in the past and used a cream or lotion that helped it to come back. This was when her thyroid was having problems. Denies diarrhea. Has cold intolerance.   [facility], [facility], social history reviewed and updated.  Review of Systems  Constitutional: Positive for appetite change. Negative for fever, activity change, fatigue and unexpected weight change.  HENT: Negative.   Eyes: Negative.   Respiratory: Negative for cough, chest tightness, shortness of breath and wheezing.   Cardiovascular: Negative for chest pain, palpitations and leg swelling.  Gastrointestinal: Negative for abdominal pain, diarrhea, constipation, blood in stool and abdominal distention.  Musculoskeletal: Positive for gait problem. Negative for myalgias, back pain and arthralgias.  Neurological: Positive for weakness. Negative for dizziness and light-headedness.  Psychiatric/Behavioral: Negative.       Objective:   Physical Exam  Constitutional: She appears well-developed and well-nourished.  Well kempt and alert  HENT:  Head: Normocephalic and  atraumatic.  After removing her wig there are patches in the front without hair, multiple dark skin patches. She thinks they have grown but not sure.   Eyes: EOM are normal.  Neck: Normal range of motion.  Cardiovascular: Normal rate.   Pulmonary/Chest: Effort normal and breath sounds normal. No respiratory distress. She has no wheezes. She has no rales.  Abdominal: Soft. Bowel sounds are normal. She exhibits no distension. There is no tenderness. There is no rebound.  Musculoskeletal: She exhibits no edema.  Neurological:  Alert, has cane with her but does not use to walk much  Skin: Skin is warm and dry.  Psychiatric: She has a normal mood and affect.   Filed Vitals:   01/10/15 0914  BP: 142/82  Pulse: 66  Temp: 98.3 F (36.8 C)  TempSrc: Oral  Resp: 12  Height: 5\' 1"  (1.549 m)  Weight: 121 lb 12.8 oz (55.248 kg)  SpO2: 97%      Assessment & Plan:

## 2015-01-10 NOTE — Assessment & Plan Note (Signed)
Several lesions on her scalp which are dark and she thinks growing. Concerning and referral to dermatology for review and assessment.

## 2015-01-10 NOTE — Telephone Encounter (Signed)
[facility] [address]

## 2015-01-10 NOTE — Patient Instructions (Signed)
We will check some blood work today to see if we can find why the hair is thinning. We would also like you to go to a dermatologist (skin doctor) to look at those spots on your head to see if they need to be removed.   Fall Prevention and Home Safety Falls cause injuries and can affect all age groups. It is possible to use preventive measures to significantly decrease the likelihood of falls. There are many simple measures which can make your home safer and prevent falls. OUTDOORS  Repair cracks and edges of walkways and driveways.  Remove high doorway thresholds.  Trim shrubbery on the main path into your home.  Have good outside lighting.  Clear walkways of tools, rocks, debris, and clutter.  Check that handrails are not broken and are securely fastened. Both sides of steps should have handrails.  Have leaves, snow, and ice cleared regularly.  Use sand or salt on walkways during winter months.  In the garage, clean up grease or oil spills. BATHROOM  Install night lights.  Install grab bars by the toilet and in the tub and shower.  Use non-skid mats or decals in the tub or shower.  Place a plastic non-slip stool in the shower to sit on, if needed.  Keep floors dry and clean up all water on the floor immediately.  Remove soap buildup in the tub or shower on a regular basis.  Secure bath mats with non-slip, double-sided rug tape.  Remove throw rugs and tripping hazards from the floors. BEDROOMS  Install night lights.  Make sure a bedside light is easy to reach.  Do not use oversized bedding.  Keep a telephone by your bedside.  Have a firm chair with side arms to use for getting dressed.  Remove throw rugs and tripping hazards from the floor. KITCHEN  Keep handles on pots and pans turned toward the center of the stove. Use back burners when possible.  Clean up spills quickly and allow time for drying.  Avoid walking on wet floors.  Avoid hot utensils and  knives.  Position shelves so they are not too high or low.  Place commonly used objects within easy reach.  If necessary, use a sturdy step stool with a grab bar when reaching.  Keep electrical cables out of the way.  Do not use floor polish or wax that makes floors slippery. If you must use wax, use non-skid floor wax.  Remove throw rugs and tripping hazards from the floor. STAIRWAYS  Never leave objects on stairs.  Place handrails on both sides of stairways and use them. Fix any loose handrails. Make sure handrails on both sides of the stairways are as long as the stairs.  Check carpeting to make sure it is firmly attached along stairs. Make repairs to worn or loose carpet promptly.  Avoid placing throw rugs at the top or bottom of stairways, or properly secure the rug with carpet tape to prevent slippage. Get rid of throw rugs, if possible.  Have an electrician put in a light switch at the top and bottom of the stairs. OTHER FALL PREVENTION TIPS  Wear low-heel or rubber-soled shoes that are supportive and fit well. Wear closed toe shoes.  When using a stepladder, make sure it is fully opened and both spreaders are firmly locked. Do not climb a closed stepladder.  Add color or contrast paint or tape to grab bars and handrails in your home. Place contrasting color strips on first and last  steps.  Learn and use mobility aids as needed. Install an electrical emergency response system.  Turn on lights to avoid dark areas. Replace light bulbs that burn out immediately. Get light switches that glow.  Arrange furniture to create clear pathways. Keep furniture in the same place.  Firmly attach carpet with non-skid or double-sided tape.  Eliminate uneven floor surfaces.  Select a carpet pattern that does not visually hide the edge of steps.  Be aware of all pets. OTHER HOME SAFETY TIPS  Set the water temperature for 120 F (48.8 C).  Keep emergency numbers on or near the  telephone.  Keep smoke detectors on every level of the home and near sleeping areas. Document Released: 06/21/2002 Document Revised: 12/31/2011 Document Reviewed: 09/20/2011 [facility] Patient Information 2015 [place], [place]. This information is not intended to replace advice given to you by your health care provider. Make sure you discuss any questions you have with your health care provider.

## 2015-01-10 NOTE — Assessment & Plan Note (Signed)
Checking HgA1c, last in our system 6.1 off medication. Advised her that she does not need to be checking her sugars. Last eye exam 09/2014 reviewed and without changes. Foot exam normal today.

## 2015-01-11 ENCOUNTER — Other Ambulatory Visit: Payer: Self-pay | Admitting: Geriatric Medicine

## 2015-01-11 MED ORDER — EZETIMIBE 10 MG PO TABS: 10.0000 mg | ORAL_TABLET | Freq: Every day | ORAL | Status: DC

## 2015-01-11 NOTE — Telephone Encounter (Signed)
Sent to pharmacy 

## 2015-01-23 ENCOUNTER — Encounter: Payer: Self-pay | Admitting: Internal Medicine

## 2015-01-26 DIAGNOSIS — L821 Other seborrheic keratosis: Secondary | ICD-10-CM | POA: Diagnosis not present

## 2015-01-26 DIAGNOSIS — L649 Androgenic alopecia, unspecified: Secondary | ICD-10-CM | POA: Diagnosis not present

## 2015-02-17 DIAGNOSIS — Z23 Encounter for immunization: Secondary | ICD-10-CM | POA: Diagnosis not present

## 2015-02-21 DIAGNOSIS — L648 Other androgenic alopecia: Secondary | ICD-10-CM | POA: Diagnosis not present

## 2015-03-06 DIAGNOSIS — L649 Androgenic alopecia, unspecified: Secondary | ICD-10-CM | POA: Diagnosis not present

## 2015-04-03 DIAGNOSIS — L602 Onychogryphosis: Secondary | ICD-10-CM | POA: Diagnosis not present

## 2015-04-03 DIAGNOSIS — E119 Type 2 diabetes mellitus without complications: Secondary | ICD-10-CM | POA: Diagnosis not present

## 2015-04-07 ENCOUNTER — Other Ambulatory Visit: Payer: Self-pay | Admitting: Internal Medicine

## 2015-05-04 ENCOUNTER — Encounter: Payer: Self-pay | Admitting: *Deleted

## 2015-05-09 ENCOUNTER — Encounter: Payer: Self-pay | Admitting: Cardiovascular Disease

## 2015-05-09 ENCOUNTER — Ambulatory Visit (INDEPENDENT_AMBULATORY_CARE_PROVIDER_SITE_OTHER): Payer: Medicare Other | Admitting: Cardiovascular Disease

## 2015-05-09 VITALS — BP 162/70 | HR 65 | Ht 61.0 in | Wt 122.3 lb

## 2015-05-09 DIAGNOSIS — I442 Atrioventricular block, complete: Secondary | ICD-10-CM | POA: Diagnosis not present

## 2015-05-09 DIAGNOSIS — E785 Hyperlipidemia, unspecified: Secondary | ICD-10-CM | POA: Diagnosis not present

## 2015-05-09 DIAGNOSIS — Z79899 Other long term (current) drug therapy: Secondary | ICD-10-CM

## 2015-05-09 DIAGNOSIS — I10 Essential (primary) hypertension: Secondary | ICD-10-CM

## 2015-05-09 DIAGNOSIS — Z950 Presence of cardiac pacemaker: Secondary | ICD-10-CM

## 2015-05-09 LAB — CUP PACEART INCLINIC DEVICE CHECK
Battery Remaining Longevity: 122.4
Brady Statistic RA Percent Paced: 30 %
Brady Statistic RV Percent Paced: 0.16 %
Date Time Interrogation Session: 20161025095357
Implantable Lead Implant Date: 20110103
Implantable Lead Location: 753860
Lead Channel Pacing Threshold Amplitude: 0.5 V
Lead Channel Pacing Threshold Pulse Width: 0.4 ms
Lead Channel Sensing Intrinsic Amplitude: 2.1 mV
Lead Channel Setting Pacing Amplitude: 0.875
MDC IDC LEAD IMPLANT DT: 20110103
MDC IDC LEAD LOCATION: 753859
MDC IDC MSMT BATTERY VOLTAGE: 2.93 V
MDC IDC MSMT LEADCHNL RA IMPEDANCE VALUE: 375 Ohm
MDC IDC MSMT LEADCHNL RV IMPEDANCE VALUE: 462.5 Ohm
MDC IDC MSMT LEADCHNL RV PACING THRESHOLD AMPLITUDE: 0.625 V
MDC IDC MSMT LEADCHNL RV PACING THRESHOLD PULSEWIDTH: 0.4 ms
MDC IDC MSMT LEADCHNL RV SENSING INTR AMPL: 10.4 mV
MDC IDC SET LEADCHNL RA PACING AMPLITUDE: 1.5 V
MDC IDC SET LEADCHNL RV PACING PULSEWIDTH: 0.4 ms
MDC IDC SET LEADCHNL RV SENSING SENSITIVITY: 2.5 mV
Pulse Gen Model: 2110
Pulse Gen Serial Number: 7098440

## 2015-05-09 NOTE — Patient Instructions (Addendum)
Your physician recommends that you schedule a follow-up appointment in: 6 months with Dr.Croitoru with pacemaker check (ST JUDE). 
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
## 2015-05-10 DIAGNOSIS — L821 Other seborrheic keratosis: Secondary | ICD-10-CM | POA: Diagnosis not present

## 2015-05-10 DIAGNOSIS — L648 Other androgenic alopecia: Secondary | ICD-10-CM | POA: Diagnosis not present

## 2015-05-11 DIAGNOSIS — Z79899 Other long term (current) drug therapy: Secondary | ICD-10-CM | POA: Diagnosis not present

## 2015-05-11 DIAGNOSIS — E785 Hyperlipidemia, unspecified: Secondary | ICD-10-CM | POA: Diagnosis not present

## 2015-05-11 LAB — LIPID PANEL
CHOL/HDL RATIO: 2.3 ratio (ref <=5.0)
Cholesterol: 173 mg/dL (ref 125–200)
HDL: 75 mg/dL (ref >=46)
LDL CALC: 85 mg/dL (ref <130)
Triglycerides: 65 mg/dL (ref <150)
VLDL: 13 mg/dL (ref <30)

## 2015-05-11 LAB — COMPREHENSIVE METABOLIC PANEL
ALT: 21 U/L (ref 6–29)
AST: 22 U/L (ref 10–35)
Albumin: 3.7 g/dL (ref 3.6–5.1)
Alkaline Phosphatase: 77 U/L (ref 33–130)
BUN: 21 mg/dL (ref 7–25)
CALCIUM: 9.3 mg/dL (ref 8.6–10.4)
CO2: 31 mmol/L (ref 20–31)
Chloride: 105 mmol/L (ref 98–110)
Creat: 0.91 mg/dL — ABNORMAL HIGH (ref 0.60–0.88)
GLUCOSE: 89 mg/dL (ref 65–99)
POTASSIUM: 4.7 mmol/L (ref 3.5–5.3)
Sodium: 142 mmol/L (ref 135–146)
Total Bilirubin: 0.9 mg/dL (ref 0.2–1.2)
Total Protein: 6.6 g/dL (ref 6.1–8.1)

## 2015-06-07 DIAGNOSIS — L602 Onychogryphosis: Secondary | ICD-10-CM | POA: Diagnosis not present

## 2015-06-07 DIAGNOSIS — E1351 Other specified diabetes mellitus with diabetic peripheral angiopathy without gangrene: Secondary | ICD-10-CM | POA: Diagnosis not present

## 2015-06-07 DIAGNOSIS — I70293 Other atherosclerosis of native arteries of extremities, bilateral legs: Secondary | ICD-10-CM | POA: Diagnosis not present

## 2015-06-13 DIAGNOSIS — H401111 Primary open-angle glaucoma, right eye, mild stage: Secondary | ICD-10-CM | POA: Diagnosis not present

## 2015-06-13 DIAGNOSIS — H04123 Dry eye syndrome of bilateral lacrimal glands: Secondary | ICD-10-CM | POA: Diagnosis not present

## 2015-06-13 DIAGNOSIS — H401121 Primary open-angle glaucoma, left eye, mild stage: Secondary | ICD-10-CM | POA: Diagnosis not present

## 2015-06-13 DIAGNOSIS — H401131 Primary open-angle glaucoma, bilateral, mild stage: Secondary | ICD-10-CM | POA: Diagnosis not present

## 2015-07-04 ENCOUNTER — Telehealth: Payer: Self-pay | Admitting: Internal Medicine

## 2015-07-04 NOTE — Telephone Encounter (Signed)
Pt called stated that due to insurance purposes she will not be coming to see Dr. Okey Duprerawford. She wants to thank you for everything that Dr. Okey Duprerawford did for her.

## 2015-07-12 ENCOUNTER — Ambulatory Visit: Payer: Medicare Other | Admitting: Internal Medicine

## 2015-08-07 DIAGNOSIS — R7302 Impaired glucose tolerance (oral): Secondary | ICD-10-CM | POA: Diagnosis not present

## 2015-08-07 DIAGNOSIS — Z23 Encounter for immunization: Secondary | ICD-10-CM | POA: Diagnosis not present

## 2015-08-07 DIAGNOSIS — E785 Hyperlipidemia, unspecified: Secondary | ICD-10-CM | POA: Diagnosis not present

## 2015-08-07 DIAGNOSIS — I10 Essential (primary) hypertension: Secondary | ICD-10-CM | POA: Diagnosis not present

## 2015-08-16 ENCOUNTER — Other Ambulatory Visit: Payer: Self-pay

## 2015-08-16 DIAGNOSIS — Z1231 Encounter for screening mammogram for malignant neoplasm of breast: Secondary | ICD-10-CM

## 2015-09-07 DIAGNOSIS — I70293 Other atherosclerosis of native arteries of extremities, bilateral legs: Secondary | ICD-10-CM | POA: Diagnosis not present

## 2015-09-07 DIAGNOSIS — L602 Onychogryphosis: Secondary | ICD-10-CM | POA: Diagnosis not present

## 2015-09-07 DIAGNOSIS — E1351 Other specified diabetes mellitus with diabetic peripheral angiopathy without gangrene: Secondary | ICD-10-CM | POA: Diagnosis not present

## 2015-09-26 ENCOUNTER — Ambulatory Visit
Admission: RE | Admit: 2015-09-26 | Discharge: 2015-09-26 | Disposition: A | Discharge status: Home / Self Care | Payer: Medicare Other | Source: Ambulatory Visit

## 2015-09-26 DIAGNOSIS — Z1231 Encounter for screening mammogram for malignant neoplasm of breast: Secondary | ICD-10-CM | POA: Diagnosis not present

## 2015-10-12 DIAGNOSIS — H04123 Dry eye syndrome of bilateral lacrimal glands: Secondary | ICD-10-CM | POA: Diagnosis not present

## 2015-10-12 DIAGNOSIS — H401121 Primary open-angle glaucoma, left eye, mild stage: Secondary | ICD-10-CM | POA: Diagnosis not present

## 2015-10-12 DIAGNOSIS — Z961 Presence of intraocular lens: Secondary | ICD-10-CM | POA: Diagnosis not present

## 2015-10-12 DIAGNOSIS — H35033 Hypertensive retinopathy, bilateral: Secondary | ICD-10-CM | POA: Diagnosis not present

## 2015-10-12 DIAGNOSIS — H401111 Primary open-angle glaucoma, right eye, mild stage: Secondary | ICD-10-CM | POA: Diagnosis not present

## 2015-10-12 DIAGNOSIS — E119 Type 2 diabetes mellitus without complications: Secondary | ICD-10-CM | POA: Diagnosis not present

## 2015-10-23 ENCOUNTER — Encounter (HOSPITAL_COMMUNITY): Payer: Self-pay | Admitting: Emergency Medicine

## 2015-10-23 ENCOUNTER — Emergency Department (HOSPITAL_COMMUNITY)
Admission: EM | Admit: 2015-10-23 | Discharge: 2015-10-23 | Disposition: A | Discharge status: Home / Self Care | Payer: Medicare Other | Attending: Emergency Medicine | Admitting: Emergency Medicine

## 2015-10-23 DIAGNOSIS — Z950 Presence of cardiac pacemaker: Secondary | ICD-10-CM | POA: Diagnosis not present

## 2015-10-23 DIAGNOSIS — I2510 Atherosclerotic heart disease of native coronary artery without angina pectoris: Secondary | ICD-10-CM | POA: Insufficient documentation

## 2015-10-23 DIAGNOSIS — R2243 Localized swelling, mass and lump, lower limb, bilateral: Secondary | ICD-10-CM | POA: Diagnosis not present

## 2015-10-23 DIAGNOSIS — R300 Dysuria: Secondary | ICD-10-CM | POA: Diagnosis present

## 2015-10-23 DIAGNOSIS — M7989 Other specified soft tissue disorders: Secondary | ICD-10-CM

## 2015-10-23 DIAGNOSIS — Z79899 Other long term (current) drug therapy: Secondary | ICD-10-CM | POA: Insufficient documentation

## 2015-10-23 DIAGNOSIS — G8929 Other chronic pain: Secondary | ICD-10-CM | POA: Diagnosis not present

## 2015-10-23 DIAGNOSIS — Z8639 Personal history of other endocrine, nutritional and metabolic disease: Secondary | ICD-10-CM | POA: Diagnosis not present

## 2015-10-23 DIAGNOSIS — R319 Hematuria, unspecified: Secondary | ICD-10-CM | POA: Diagnosis not present

## 2015-10-23 DIAGNOSIS — I252 Old myocardial infarction: Secondary | ICD-10-CM | POA: Diagnosis not present

## 2015-10-23 DIAGNOSIS — Z880 Allergy status to penicillin: Secondary | ICD-10-CM | POA: Insufficient documentation

## 2015-10-23 DIAGNOSIS — Z8739 Personal history of other diseases of the musculoskeletal system and connective tissue: Secondary | ICD-10-CM | POA: Diagnosis not present

## 2015-10-23 DIAGNOSIS — I10 Essential (primary) hypertension: Secondary | ICD-10-CM | POA: Insufficient documentation

## 2015-10-23 LAB — BASIC METABOLIC PANEL
ANION GAP: 9 (ref 5–15)
BUN: 18 mg/dL (ref 6–20)
CHLORIDE: 106 mmol/L (ref 101–111)
CO2: 29 mmol/L (ref 22–32)
Calcium: 9.4 mg/dL (ref 8.9–10.3)
Creatinine, Ser: 0.87 mg/dL (ref 0.44–1.00)
GFR, EST NON AFRICAN AMERICAN: 57 mL/min — AB (ref >60)
Glucose, Bld: 144 mg/dL — ABNORMAL HIGH (ref 65–99)
POTASSIUM: 4.1 mmol/L (ref 3.5–5.1)
SODIUM: 144 mmol/L (ref 135–145)

## 2015-10-23 LAB — WET PREP, GENITAL
CLUE CELLS WET PREP: NONE SEEN
Sperm: NONE SEEN
TRICH WET PREP: NONE SEEN
Yeast Wet Prep HPF POC: NONE SEEN

## 2015-10-23 LAB — URINALYSIS, ROUTINE W REFLEX MICROSCOPIC
Bilirubin Urine: NEGATIVE
GLUCOSE, UA: NEGATIVE mg/dL
Ketones, ur: 15 mg/dL — AB
NITRITE: NEGATIVE
PH: 7.5 (ref 5.0–8.0)
PROTEIN: 100 mg/dL — AB
Specific Gravity, Urine: 1.014 (ref 1.005–1.030)

## 2015-10-23 LAB — CBC
HEMATOCRIT: 44.4 % (ref 36.0–46.0)
Hemoglobin: 14 g/dL (ref 12.0–15.0)
MCH: 30.7 pg (ref 26.0–34.0)
MCHC: 31.5 g/dL (ref 30.0–36.0)
MCV: 97.4 fL (ref 78.0–100.0)
Platelets: 224 10*3/uL (ref 150–400)
RBC: 4.56 MIL/uL (ref 3.87–5.11)
RDW: 13.9 % (ref 11.5–15.5)
WBC: 5.6 10*3/uL (ref 4.0–10.5)

## 2015-10-23 LAB — URINE MICROSCOPIC-ADD ON

## 2015-10-23 MED ORDER — FOSFOMYCIN TROMETHAMINE 3 G PO PACK
3.0000 g | PACK | Freq: Once | ORAL | Status: AC
  Administered 2015-10-23: 3 g via ORAL
  Filled 2015-10-23: qty 3

## 2015-10-23 NOTE — Discharge Instructions (Signed)

## 2015-10-23 NOTE — ED Notes (Signed)
Pt states she started having  bleeding and burning while urinating today. Pt also states she has been having leg swelling over the past four days. States she took HTCZ yesterday, she does not usually take it every day.

## 2015-10-23 NOTE — ED Provider Notes (Signed)
CSN: [id number]     Arrival date & time 10/23/15  1758 History   First MD Initiated Contact with Patient 10/23/15 2213     Chief Complaint  Patient presents with  . Leg Swelling  . Dysuria     (Consider location/radiation/quality/duration/timing/severity/associated sxs/prior Treatment) Patient is a 82 y.o. female presenting with dysuria and general illness. The history is provided by the patient.  Dysuria Associated symptoms: no fever, no nausea and no vomiting   Illness Severity:  Moderate Onset quality:  Sudden Duration:  1 day Timing:  Constant Progression:  Unchanged Chronicity:  New Associated symptoms: no chest pain, no congestion, no fever, no headaches, no myalgias, no nausea, no rhinorrhea, no shortness of breath, no vomiting and no wheezing    82 yo F with leg swelling, dysuria and Bleeding. Patient states that she's noticed blood in her underwear and on a pad since this morning. She thinks it's likely coming from her vagina. Doesn't think that she's been pooping blood. Denies any fevers or chills or abdominal pain. Patient has had some lower extremity edema for the past year. Is comes and goes. Isn't significantly changed today. The patient had a hysterectomy and oophorectomy done about 60 years ago.  Past Medical History  Diagnosis Date  . Presence of permanent cardiac pacemaker 07/17/2009    St.Jude  . Coronary artery disease   . MI (myocardial infarction) (HCC)   . Hypertension   . Hyperlipemia   . Chronic back pain   . DDD (degenerative disc disease), lumbar   . DDD (degenerative disc disease), cervical   . Chronic neck pain   . Radiculopathy   . Sciatic nerve pain    Past Surgical History  Procedure Laterality Date  . Pacemaker insertion  07/17/2009    left side-St.Jude  . Appendectomy  1944  . Abdominal hysterectomy  1956  . Cataract extraction Right   . Back surgery  2004  . US echocardiography  11/28/2011    Normal  . Nm myocar perf wall motion   09/06/2009    Normal   Family History  Problem Relation Age of Onset  . Heart disease Father   . Heart disease Sister   . Cancer Brother   . Diabetes Neg Hx   . Hypertension Father   . Stroke Father    Social History  Substance Use Topics  . Smoking status: Never Smoker   . Smokeless tobacco: None  . Alcohol Use: No   OB History    No data available     Review of Systems  Constitutional: Negative for fever and chills.  HENT: Negative for congestion and rhinorrhea.   Eyes: Negative for redness and visual disturbance.  Respiratory: Negative for shortness of breath and wheezing.   Cardiovascular: Positive for leg swelling (for at least a year). Negative for chest pain and palpitations.  Gastrointestinal: Negative for nausea and vomiting.  Genitourinary: Positive for hematuria. Negative for dysuria and urgency.  Musculoskeletal: Negative for myalgias and arthralgias.  Skin: Negative for pallor and wound.  Neurological: Negative for dizziness and headaches.      Allergies  Lovastatin; Penicillins; and Pravastatin sodium  Home Medications   Prior to Admission medications   Medication Sig Start Date End Date Taking? Authorizing Provider  hydrochlorothiazide (MICROZIDE) 12.5 MG capsule Take 1 capsule as needed for swelling. 04/29/14  Yes Lennette Bihari, MD  latanoprost (XALATAN) 0.005 % ophthalmic solution Place 1 drop into both eyes at bedtime.  09/13/14  Yes Historical  Provider, MD  metoprolol succinate (TOPROL-XL) 25 MG 24 hr tablet take 1 tablet by mouth once daily 04/07/15  Yes Myrlene Broker, MD  ezetimibe (ZETIA) 10 MG tablet Take 1 tablet (10 mg total) by mouth daily. Patient not taking: Reported on 10/23/2015 01/11/15   Myrlene Broker, MD   BP 178/62 mmHg  Pulse 72  Temp(Src) 98.5 F (36.9 C) (Oral)  Resp 21  Ht  (1.549 m)  Wt 131 lb 1.6 oz (59.467 kg)  BMI 24.78 kg/m2  SpO2 100% Physical Exam  Constitutional: She is oriented to person, place,  and time. She appears well-developed and well-nourished. No distress.  HENT:  Head: Normocephalic and atraumatic.  Eyes: EOM are normal. Pupils are equal, round, and reactive to light.  Neck: Normal range of motion. Neck supple.  Cardiovascular: Normal rate and regular rhythm.  Exam reveals no gallop and no friction rub.   No murmur heard. Pulmonary/Chest: Effort normal. She has no wheezes. She has no rales.  Abdominal: Soft. She exhibits no distension. There is no tenderness. There is no rebound and no guarding.  Genitourinary: There is no injury on the right labia. There is no injury on the left labia. Right adnexum displays no mass, no tenderness and no fullness. Left adnexum displays no mass, no tenderness and no fullness.  Musculoskeletal: She exhibits edema (bilateral). She exhibits no tenderness.  Neurological: She is alert and oriented to person, place, and time.  Skin: Skin is warm and dry. She is not diaphoretic.  Psychiatric: She has a normal mood and affect. Her behavior is normal.  Nursing note and vitals reviewed.   ED Course  Procedures (including critical care time) Labs Review Labs Reviewed  BASIC METABOLIC PANEL - Abnormal; Notable for the following:    Glucose, Bld 144 (*)    GFR calc non Af Amer 57 (*)    All other components within normal limits  URINALYSIS, ROUTINE W REFLEX MICROSCOPIC (NOT AT [facility]) - Abnormal; Notable for the following:    Color, Urine RED (*)    APPearance TURBID (*)    Hgb urine dipstick LARGE (*)    Ketones, ur 15 (*)    Protein, ur 100 (*)    Leukocytes, UA LARGE (*)    All other components within normal limits  URINE MICROSCOPIC-ADD ON - Abnormal; Notable for the following:    Squamous Epithelial / LPF 0-5 (*)    Bacteria, UA FEW (*)    All other components within normal limits  WET PREP, GENITAL  CBC  GC/CHLAMYDIA PROBE AMP ([place]) NOT AT [facility]    Imaging Review No results found. I have personally reviewed and evaluated  these images and lab results as part of my medical decision-making.   EKG Interpretation   Date/Time:  Monday October 23 2015 18:11:33 EDT Ventricular Rate:  75 PR Interval:  134 QRS Duration: 136 QT Interval:  432 QTC Calculation: 482 R Axis:   107 Text Interpretation:  Normal sinus rhythm Right bundle branch block  Abnormal ECG No significant change since last tracing Confirmed by KNOTT  MD, Reuel Boom ([id number]) on 10/23/2015 6:14:10 PM      MDM   Final diagnoses:  Leg swelling  Hematuria    82 yo F with a chief complaint of lower extremity edema and bleeding. Patient with no noted blood in the vault, rectal exam without any blood. Hbg at baseline.  She has some blood at the opening to her urethra. Suspect this is  hematuria. Patient's UA is likely the result of multiple red blood cells however will treat with fosfomycin for possible infection. Urology follow-up.  11:24 PM:  I have discussed the diagnosis/risks/treatment options with the patient and family and believe the pt to be eligible for discharge home to follow-up with Urology. We also discussed returning to the ED immediately if new or worsening sx occur. We discussed the sx which are most concerning (e.g., sudden worsening pain, fever, inability to tolerate by mouth) that necessitate immediate return. Medications administered to the patient during their visit and any new prescriptions provided to the patient are listed below.  Medications given during this visit Medications  fosfomycin (MONUROL) packet 3 g (3 g Oral Given 10/23/15 2308)    Discharge Medication List as of 10/23/2015 10:50 PM      The patient appears reasonably screen and/or stabilized for discharge and I doubt any other medical condition or other [facility] requiring further screening, evaluation, or treatment in the ED at this time prior to discharge.     Melene Planan Kahleb Mcclane, DO 10/23/15 2325

## 2015-10-23 NOTE — ED Notes (Signed)
Pelvic exam performed by Dr. Adela LankFloyd and Kenney Housemananya - EMT assisted.

## 2015-10-24 LAB — GC/CHLAMYDIA PROBE AMP (~~LOC~~) NOT AT ARMC
CHLAMYDIA, DNA PROBE: NEGATIVE
Neisseria Gonorrhea: NEGATIVE

## 2015-10-30 DIAGNOSIS — N390 Urinary tract infection, site not specified: Secondary | ICD-10-CM | POA: Diagnosis not present

## 2015-10-30 DIAGNOSIS — I10 Essential (primary) hypertension: Secondary | ICD-10-CM | POA: Diagnosis not present

## 2015-10-30 DIAGNOSIS — R319 Hematuria, unspecified: Secondary | ICD-10-CM | POA: Diagnosis not present

## 2015-11-06 DIAGNOSIS — R7302 Impaired glucose tolerance (oral): Secondary | ICD-10-CM | POA: Diagnosis not present

## 2015-11-06 DIAGNOSIS — M25372 Other instability, left ankle: Secondary | ICD-10-CM | POA: Diagnosis not present

## 2015-11-06 DIAGNOSIS — I10 Essential (primary) hypertension: Secondary | ICD-10-CM | POA: Diagnosis not present

## 2015-11-06 DIAGNOSIS — N3001 Acute cystitis with hematuria: Secondary | ICD-10-CM | POA: Diagnosis not present

## 2015-11-07 ENCOUNTER — Ambulatory Visit (INDEPENDENT_AMBULATORY_CARE_PROVIDER_SITE_OTHER): Payer: Medicare Other | Admitting: Cardiovascular Disease

## 2015-11-07 VITALS — BP 151/72 | HR 69 | Ht 61.0 in | Wt 136.2 lb

## 2015-11-07 DIAGNOSIS — Z950 Presence of cardiac pacemaker: Secondary | ICD-10-CM

## 2015-11-07 DIAGNOSIS — I10 Essential (primary) hypertension: Secondary | ICD-10-CM | POA: Diagnosis not present

## 2015-11-07 DIAGNOSIS — E785 Hyperlipidemia, unspecified: Secondary | ICD-10-CM | POA: Diagnosis not present

## 2015-11-07 DIAGNOSIS — I442 Atrioventricular block, complete: Secondary | ICD-10-CM | POA: Diagnosis not present

## 2015-11-07 NOTE — Progress Notes (Signed)
Patient ID: Krista Ray, female   DOB: 1922/07/20, 82 y.o.   MRN: [id number]    Cardiology Office Note    Date:  11/09/2015   ID:  Krista Ray, DOB 1923-02-21, MRN [id number]  PCP:  Myrlene Broker, MD  Cardiologist:  Nicki Guadalajara, MD; Thurmon Fair, MD   Chief Complaint  Patient presents with  . Follow-up    6 Months, pt denied SOB and swelling in legs and feet, pt denied chest pain    History of Present Illness:  Krista Ray is a 82 y.o. female with infrequent paroxysmal complete heart block and a dual-chamber permanent pacemaker returns for follow-up. His last appointment she has not had any new syncopal events. She had a couple of episodes that appear to be related to hypotension. She also denies problems with weakness, dizziness, dyspnea, angina, leg edema, claudication or other focal neurological events. She has stopped taking Zetia since she cannot afford it. He is intolerant to multiple statins.  Interrogation of her dual-chamber St. Jude Accent (2011) pacemaker shows 19% atrial pacing and only 1% ventricular pacing. The current generator has an estimated longevity of another 8-9 years. Since her last check only one episode of paroxysmal atrial tachycardia lasting for 10 beats has been recorded. This happened last month. It was asymptomatic.  She was in the emergency room on April 10 with mild hematuria and a benign workup at the time.  Past Medical History  Diagnosis Date  . Presence of permanent cardiac pacemaker 07/17/2009    St.Jude  . Coronary artery disease   . MI (myocardial infarction) (HCC)   . Hypertension   . Hyperlipemia   . Chronic back pain   . DDD (degenerative disc disease), lumbar   . DDD (degenerative disc disease), cervical   . Chronic neck pain   . Radiculopathy   . Sciatic nerve pain     Past Surgical History  Procedure Laterality Date  . Pacemaker insertion  07/17/2009    left side-St.Jude  . Appendectomy  1944  . Abdominal  hysterectomy  1956  . Cataract extraction Right   . Back surgery  2004  . US echocardiography  11/28/2011    Normal  . Nm myocar perf wall motion  09/06/2009    Normal    Current Medications: Outpatient Prescriptions Prior to Visit  Medication Sig Dispense Refill  . hydrochlorothiazide (MICROZIDE) 12.5 MG capsule Take 1 capsule as needed for swelling. 30 capsule 3  . latanoprost (XALATAN) 0.005 % ophthalmic solution Place 1 drop into both eyes at bedtime.   0  . metoprolol succinate (TOPROL-XL) 25 MG 24 hr tablet take 1 tablet by mouth once daily 90 tablet 0  . ezetimibe (ZETIA) 10 MG tablet Take 1 tablet (10 mg total) by mouth daily. (Patient not taking: Reported on 10/23/2015) 30 tablet 4   No facility-administered medications prior to visit.     Allergies:   Lovastatin; Penicillins; and Pravastatin sodium   Social History   Social History  . Marital Status: Widowed    Spouse Name: N/A  . Number of Children: N/A  . Years of Education: N/A   Social History Main Topics  . Smoking status: Never Smoker   . Smokeless tobacco: None  . Alcohol Use: No  . Drug Use: No  . Sexual Activity: No   Other Topics Concern  . None   Social History Narrative     Family History:  The patient's family history includes Cancer in her brother;  Heart disease in her father and sister; Hypertension in her father; Stroke in her father. There is no history of Diabetes.   ROS:   Please see the history of present illness.    ROS All other systems reviewed and are negative.   PHYSICAL EXAM:   VS:  BP 151/72 mmHg  Pulse 69  Ht 5\' 1"  (1.549 m)  Wt 61.78 kg (136 lb 3.2 oz)  BMI 25.75 kg/m2   GEN: Well nourished, well developed, in no acute distress HEENT: normal Neck: no JVD, carotid bruits, or masses Cardiac: RRR; no murmurs, rubs, or gallops,no edema , LC pacemaker site. Respiratory:  clear to auscultation bilaterally, normal work of breathing GI: soft, nontender, nondistended, + BS MS:  no deformity or atrophy Skin: warm and dry, no rash Neuro:  Alert and Oriented x 3, Strength and sensation are intact Psych: euthymic mood, full affect  Wt Readings from Last 3 Encounters:  11/07/15 61.78 kg (136 lb 3.2 oz)  10/23/15 59.467 kg (131 lb 1.6 oz)  05/09/15 55.475 kg (122 lb 4.8 oz)      Studies/Labs Reviewed:   EKG:  EKG is not ordered today.  The ekg ordered 04/10 demonstrates normal sinus rhythm and right bundle branch block  Recent Labs: 01/10/2015: TSH 0.86 05/11/2015: ALT 21 10/23/2015: BUN 18; Creatinine, Ser 0.87; Hemoglobin 14.0; Platelets 224; Potassium 4.1; Sodium 144   Lipid Panel    Component Value Date/Time   CHOL 173 05/11/2015 0914   TRIG 65 05/11/2015 0914   HDL 75 05/11/2015 0914   CHOLHDL 2.3 05/11/2015 0914   VLDL 13 05/11/2015 0914   LDLCALC 85 05/11/2015 0914   LDLDIRECT 137.0 02/22/2013 1006     ASSESSMENT:    1. Third degree atrioventricular block (HCC)   2. Pacemaker   3. Essential hypertension, benign   4. Hyperlipidemia      PLAN:  In order of problems listed above:  1. 3rd deg AV block: Prolonged complete heart block was documented prior to device implantation and she even required temporary transvenous pacing, but currently requires very infrequent ventricular pacing 2. PPM: Normal pacemaker function. Continue remote downloads every 2 months and office visit yearly 3. HTN: Borderline elevated blood pressure today. Usually lower at home. History of syncope probably associated with hypotension. No changes are made her medications.  4. Hyperlipidemia: intolerant to statins and unable to afford Zetia 5.     Medication Adjustments/Labs and Tests Ordered: Current medicines are reviewed at length with the patient today.  Concerns regarding medicines are outlined above.  Medication changes, Labs and Tests ordered today are listed in the Patient Instructions below. Patient Instructions  Your physician has recommended you make the  following change in your medication:  1- STOP taking zetia.   Your physician wants you to follow-up in: 6 months with Dr Royann Shiversroitoru. You will receive a reminder letter in the mail two months in advance. If you don't receive a letter, please call our office to schedule the follow-up appointment.       Joie BimlerSigned, Timmey Lamba, MD  11/09/2015 3:21 PM    [facility] [address], [place], [place]  [id number] Phone: [phone]; Fax: [phone]

## 2015-11-07 NOTE — Patient Instructions (Signed)
Your physician has recommended you make the following change in your medication:  1- STOP taking zetia.   Your physician wants you to follow-up in: 6 months with Dr Royann Shiversroitoru. You will receive a reminder letter in the mail two months in advance. If you don't receive a letter, please call our office to schedule the follow-up appointment.

## 2015-11-09 ENCOUNTER — Encounter: Payer: Self-pay | Admitting: Cardiovascular Disease

## 2015-11-28 DIAGNOSIS — L219 Seborrheic dermatitis, unspecified: Secondary | ICD-10-CM | POA: Diagnosis not present

## 2015-11-28 DIAGNOSIS — L649 Androgenic alopecia, unspecified: Secondary | ICD-10-CM | POA: Diagnosis not present

## 2015-12-05 DIAGNOSIS — L602 Onychogryphosis: Secondary | ICD-10-CM | POA: Diagnosis not present

## 2015-12-05 DIAGNOSIS — E1351 Other specified diabetes mellitus with diabetic peripheral angiopathy without gangrene: Secondary | ICD-10-CM | POA: Diagnosis not present

## 2015-12-05 DIAGNOSIS — I70293 Other atherosclerosis of native arteries of extremities, bilateral legs: Secondary | ICD-10-CM | POA: Diagnosis not present

## 2016-01-11 DIAGNOSIS — I10 Essential (primary) hypertension: Secondary | ICD-10-CM | POA: Diagnosis not present

## 2016-01-11 DIAGNOSIS — Z0000 Encounter for general adult medical examination without abnormal findings: Secondary | ICD-10-CM | POA: Diagnosis not present

## 2016-01-11 DIAGNOSIS — E785 Hyperlipidemia, unspecified: Secondary | ICD-10-CM | POA: Diagnosis not present

## 2016-01-11 DIAGNOSIS — N951 Menopausal and female climacteric states: Secondary | ICD-10-CM | POA: Diagnosis not present

## 2016-01-11 DIAGNOSIS — Z23 Encounter for immunization: Secondary | ICD-10-CM | POA: Diagnosis not present

## 2016-01-11 DIAGNOSIS — R7303 Prediabetes: Secondary | ICD-10-CM | POA: Diagnosis not present

## 2016-01-22 DIAGNOSIS — H04123 Dry eye syndrome of bilateral lacrimal glands: Secondary | ICD-10-CM | POA: Diagnosis not present

## 2016-01-22 DIAGNOSIS — G453 Amaurosis fugax: Secondary | ICD-10-CM | POA: Diagnosis not present

## 2016-01-22 DIAGNOSIS — H16223 Keratoconjunctivitis sicca, not specified as Sjogren's, bilateral: Secondary | ICD-10-CM | POA: Diagnosis not present

## 2016-01-23 ENCOUNTER — Other Ambulatory Visit: Payer: Self-pay | Admitting: Family Medicine

## 2016-01-23 DIAGNOSIS — G453 Amaurosis fugax: Secondary | ICD-10-CM

## 2016-01-26 ENCOUNTER — Ambulatory Visit
Admission: RE | Admit: 2016-01-26 | Discharge: 2016-01-26 | Disposition: A | Discharge status: Home / Self Care | Payer: Medicare Other | Source: Ambulatory Visit | Attending: Family Medicine | Admitting: Family Medicine

## 2016-01-26 DIAGNOSIS — I6523 Occlusion and stenosis of bilateral carotid arteries: Secondary | ICD-10-CM | POA: Diagnosis not present

## 2016-01-26 DIAGNOSIS — G453 Amaurosis fugax: Secondary | ICD-10-CM

## 2016-01-30 DIAGNOSIS — G453 Amaurosis fugax: Secondary | ICD-10-CM | POA: Diagnosis not present

## 2016-02-02 DIAGNOSIS — F439 Reaction to severe stress, unspecified: Secondary | ICD-10-CM | POA: Diagnosis not present

## 2016-02-02 DIAGNOSIS — I10 Essential (primary) hypertension: Secondary | ICD-10-CM | POA: Diagnosis not present

## 2016-02-02 DIAGNOSIS — G453 Amaurosis fugax: Secondary | ICD-10-CM | POA: Diagnosis not present

## 2016-02-05 DIAGNOSIS — M810 Age-related osteoporosis without current pathological fracture: Secondary | ICD-10-CM | POA: Diagnosis not present

## 2016-02-05 DIAGNOSIS — Z780 Asymptomatic menopausal state: Secondary | ICD-10-CM | POA: Diagnosis not present

## 2016-02-06 ENCOUNTER — Other Ambulatory Visit: Payer: Self-pay | Admitting: Family Medicine

## 2016-02-06 DIAGNOSIS — G453 Amaurosis fugax: Secondary | ICD-10-CM

## 2016-02-07 DIAGNOSIS — E1351 Other specified diabetes mellitus with diabetic peripheral angiopathy without gangrene: Secondary | ICD-10-CM | POA: Diagnosis not present

## 2016-02-07 DIAGNOSIS — I70293 Other atherosclerosis of native arteries of extremities, bilateral legs: Secondary | ICD-10-CM | POA: Diagnosis not present

## 2016-02-07 DIAGNOSIS — L602 Onychogryphosis: Secondary | ICD-10-CM | POA: Diagnosis not present

## 2016-02-19 ENCOUNTER — Ambulatory Visit (HOSPITAL_COMMUNITY): Payer: Medicare Other | Attending: Cardiovascular Disease

## 2016-02-19 ENCOUNTER — Other Ambulatory Visit: Payer: Self-pay

## 2016-02-19 DIAGNOSIS — I071 Rheumatic tricuspid insufficiency: Secondary | ICD-10-CM | POA: Insufficient documentation

## 2016-02-19 DIAGNOSIS — E785 Hyperlipidemia, unspecified: Secondary | ICD-10-CM | POA: Diagnosis not present

## 2016-02-19 DIAGNOSIS — I340 Nonrheumatic mitral (valve) insufficiency: Secondary | ICD-10-CM | POA: Insufficient documentation

## 2016-02-19 DIAGNOSIS — I2510 Atherosclerotic heart disease of native coronary artery without angina pectoris: Secondary | ICD-10-CM | POA: Insufficient documentation

## 2016-02-19 DIAGNOSIS — G453 Amaurosis fugax: Secondary | ICD-10-CM | POA: Diagnosis not present

## 2016-02-27 DIAGNOSIS — I10 Essential (primary) hypertension: Secondary | ICD-10-CM | POA: Diagnosis not present

## 2016-02-27 DIAGNOSIS — M810 Age-related osteoporosis without current pathological fracture: Secondary | ICD-10-CM | POA: Diagnosis not present

## 2016-05-07 ENCOUNTER — Encounter: Payer: Self-pay | Admitting: Cardiovascular Disease

## 2016-05-07 ENCOUNTER — Ambulatory Visit (INDEPENDENT_AMBULATORY_CARE_PROVIDER_SITE_OTHER): Payer: Medicare Other | Admitting: Cardiovascular Disease

## 2016-05-07 VITALS — BP 131/78 | HR 66 | Ht 61.0 in | Wt 147.0 lb

## 2016-05-07 DIAGNOSIS — Z950 Presence of cardiac pacemaker: Secondary | ICD-10-CM | POA: Diagnosis not present

## 2016-05-07 DIAGNOSIS — I10 Essential (primary) hypertension: Secondary | ICD-10-CM

## 2016-05-07 DIAGNOSIS — I442 Atrioventricular block, complete: Secondary | ICD-10-CM

## 2016-05-07 DIAGNOSIS — E7800 Pure hypercholesterolemia, unspecified: Secondary | ICD-10-CM

## 2016-05-07 NOTE — Progress Notes (Signed)
Patient ID: Krista Ray, female   DOB: 1923-03-09, 82 y.o.   MRN: [id number]    Cardiology Office Note    Date:  05/08/2016   ID:  Krista Ray, DOB 1923-03-09, MRN [id number]  PCP:  Myrlene BrokerElizabeth A Crawford, MD  Cardiologist:  Nicki Guadalajarahomas Kelly, MD; Thurmon FairMihai Marielena Harvell, MD   Chief Complaint  Patient presents with  . Follow-up    pt has no complaints     History of Present Illness:  Krista Ray is a 82 y.o. female with infrequent paroxysmal complete heart block and a dual-chamber permanent pacemaker returns for follow-up.   Since the last appointment she has not had any new syncopal events. SheDenies any falls. She also denies problems with weakness, dizziness, dyspnea, angina, leg edema, claudication or other focal neurological events.   She is intolerant to multiple statins. Does not have known coronary/fast or problems. She could not afford Zetia  Interrogation of her dual-chamber St. Jude Accent (2011) pacemaker shows 16% atrial pacing and <1% ventricular pacing. The current generator has an estimated longevity of another 8-9 years. Since her last check there have been no episodes of atrial mode switch.  Is his [place] Si[place] lives alone and clearly values her autonomy. She has a couple of nieces that call her on a daily basis, which she finds a little irritating.   Past Medical History:  Diagnosis Date  . Chronic back pain   . Chronic neck pain   . Coronary artery disease   . DDD (degenerative disc disease), cervical   . DDD (degenerative disc disease), lumbar   . Hyperlipemia   . Hypertension   . MI (myocardial infarction)   . Presence of permanent cardiac pacemaker 07/17/2009   St.Jude  . Radiculopathy   . Sciatic nerve pain     Past Surgical History:  Procedure Laterality Date  . ABDOMINAL HYSTERECTOMY  1956  . APPENDECTOMY  1944  . BACK SURGERY  2004  . CATARACT EXTRACTION Right   . NM MYOCAR PERF WALL MOTION  09/06/2009   Normal  . PACEMAKER INSERTION  07/17/2009   left side-St.Jude  . US ECHOCARDIOGRAPHY  11/28/2011   Normal    Current Medications: Outpatient Medications Prior to Visit  Medication Sig Dispense Refill  . hydrochlorothiazide (MICROZIDE) 12.5 MG capsule Take 1 capsule as needed for swelling. 30 capsule 3  . latanoprost (XALATAN) 0.005 % ophthalmic solution Place 1 drop into both eyes at bedtime.   0  . metoprolol succinate (TOPROL-XL) 25 MG 24 hr tablet take 1 tablet by mouth once daily 90 tablet 0   No facility-administered medications prior to visit.      Allergies:   Penicillins; Lovastatin; and Pravastatin sodium   Social History   Social History  . Marital status: Widowed    Spouse name: N/A  . Number of children: N/A  . Years of education: N/A   Social History Main Topics  . Smoking status: Never Smoker  . Smokeless tobacco: None  . Alcohol use No  . Drug use: No  . Sexual activity: No   Other Topics Concern  . None   Social History Narrative  . None     Family History:  The patient's family history includes Cancer in her brother; Heart disease in her father and sister; Hypertension in her father; Stroke in her father.   ROS:   Please see the history of present illness.    ROS All other systems reviewed and are negative.   PHYSICAL EXAM:  VS:  BP 131/78 (BP Location: Right Arm, Patient Position: Sitting, Cuff Size: Normal)   Pulse 66   Ht 5\' 1"  (1.549 m)   Wt 147 lb (66.7 kg)   SpO2 97%   BMI 27.78 kg/m    GEN: Well nourished, well developed, in no acute distress  HEENT: normal  Neck: no JVD, carotid bruits, or masses Cardiac: RRR; no murmurs, rubs, or gallops,no edema , healthy pacemaker site. Respiratory:  clear to auscultation bilaterally, normal work of breathing GI: soft, nontender, nondistended, + BS MS: no deformity or atrophy  Skin: warm and dry, no rash Neuro:  Alert and Oriented x 3, Strength and sensation are intact Psych: euthymic mood, full affect  Wt Readings from Last 3  Encounters:  05/07/16 147 lb (66.7 kg)  11/07/15 136 lb 3.2 oz (61.8 kg)  10/23/15 131 lb 1.6 oz (59.5 kg)      Studies/Labs Reviewed:   EKG:  EKG is ordered today.  The ekg demonstrates normal sinus rhythm and right bundle branch block and left posterior fascicular block  Recent Labs: 05/11/2015: ALT 21 10/23/2015: BUN 18; Creatinine, Ser 0.87; Hemoglobin 14.0; Platelets 224; Potassium 4.1; Sodium 144   Lipid Panel    Component Value Date/Time   CHOL 173 05/11/2015 0914   TRIG 65 05/11/2015 0914   HDL 75 05/11/2015 0914   CHOLHDL 2.3 05/11/2015 0914   VLDL 13 05/11/2015 0914   LDLCALC 85 05/11/2015 0914   LDLDIRECT 137.0 02/22/2013 1006     ASSESSMENT:    1. Third degree atrioventricular block (HCC)   2. Pacemaker   3. Essential hypertension   4. Pure hypercholesterolemia      PLAN:  In order of problems listed above:  1. 3rd deg AV block: Prolonged complete heart block was documented prior to device implantation and she even required temporary transvenous pacing, but currently requires very infrequent ventricular pacing 2. PPM: Normal pacemaker function. Continue remote downloads every 3 months and office visit yearly 3. HTN: Well controlled. History of syncope probably associated with hypotension when she took more BP meds in the past 4. Hyperlipidemia: intolerant to statins and unable to afford Zetia. The absence of established vascular disease, would not insist on therapy in this nonagenarian    Medication Adjustments/Labs and Tests Ordered: Current medicines are reviewed at length with the patient today.  Concerns regarding medicines are outlined above.  Medication changes, Labs and Tests ordered today are listed in the Patient Instructions below. Patient Instructions  Dr Royann Shivers recommends that you continue on your current medications as directed. Please refer to the Current Medication list given to you today.  Remote monitoring is used to monitor your  Pacemaker of ICD from home. This monitoring reduces the number of office visits required to check your device to one time per year. It allows [place] to keep an eye on the functioning of your device to ensure it is working properly. You are scheduled for a device check from home on Tuesday, January 23rd, 2018. You may send your transmission at any time that day. If you have a wireless device, the transmission will be sent automatically. After your physician reviews your transmission, you will receive a postcard with your next transmission date.  Your physician recommends that you schedule a follow-up appointment in 6 months with Dr Tresa Endo.  Dr Royann Shivers recommends that you schedule a follow-up appointment in 12 months with a device check. You will receive a reminder letter in the mail two months in advance.  If you don't receive a letter, please call our office to schedule the follow-up appointment.  If you need a refill on your cardiac medications before your next appointment, please call your pharmacy.    Signed, Thurmon Fair, MD  05/08/2016 4:23 PM    [facility] [address] [place], [place], [place]  [address] Phone: [phone]; Fax: [phone]

## 2016-05-07 NOTE — Patient Instructions (Signed)
Dr Royann Shiversroitoru recommends that you continue on your current medications as directed. Please refer to the Current Medication list given to you today.  Remote monitoring is used to monitor your Pacemaker of ICD from home. This monitoring reduces the number of office visits required to check your device to one time per year. It allows us to keep an eye on the functioning of your device to ensure it is working properly. You are scheduled for a device check from home on Tuesday, January 23rd, 2018. You may send your transmission at any time that day. If you have a wireless device, the transmission will be sent automatically. After your physician reviews your transmission, you will receive a postcard with your next transmission date.  Your physician recommends that you schedule a follow-up appointment in 6 months with Dr Tresa EndoKelly.  Dr Royann Shiversroitoru recommends that you schedule a follow-up appointment in 12 months with a device check. You will receive a reminder letter in the mail two months in advance. If you don't receive a letter, please call our office to schedule the follow-up appointment.  If you need a refill on your cardiac medications before your next appointment, please call your pharmacy.

## 2016-05-08 ENCOUNTER — Encounter: Payer: Self-pay | Admitting: Cardiovascular Disease

## 2016-05-08 DIAGNOSIS — I739 Peripheral vascular disease, unspecified: Secondary | ICD-10-CM | POA: Diagnosis not present

## 2016-05-08 DIAGNOSIS — E1151 Type 2 diabetes mellitus with diabetic peripheral angiopathy without gangrene: Secondary | ICD-10-CM | POA: Diagnosis not present

## 2016-05-08 DIAGNOSIS — L602 Onychogryphosis: Secondary | ICD-10-CM | POA: Diagnosis not present

## 2016-05-13 ENCOUNTER — Telehealth: Payer: Self-pay | Admitting: Cardiovascular Disease

## 2016-05-13 NOTE — Telephone Encounter (Signed)
New Message  Pt call requesting to speak with RN about appt with Dr. Royann Shiversroitoru on 10/24. Please call back to discuss

## 2016-05-13 NOTE — Telephone Encounter (Signed)
Pt called w question regarding scheduling f/u visit w Dr. Tresa EndoKelly. Question addressed and recall notification changed per patient request.

## 2016-05-23 DIAGNOSIS — G453 Amaurosis fugax: Secondary | ICD-10-CM | POA: Diagnosis not present

## 2016-05-23 DIAGNOSIS — H401131 Primary open-angle glaucoma, bilateral, mild stage: Secondary | ICD-10-CM | POA: Diagnosis not present

## 2016-05-23 DIAGNOSIS — H04123 Dry eye syndrome of bilateral lacrimal glands: Secondary | ICD-10-CM | POA: Diagnosis not present

## 2016-06-03 DIAGNOSIS — R7301 Impaired fasting glucose: Secondary | ICD-10-CM | POA: Diagnosis not present

## 2016-06-03 DIAGNOSIS — I10 Essential (primary) hypertension: Secondary | ICD-10-CM | POA: Diagnosis not present

## 2016-06-03 DIAGNOSIS — Z23 Encounter for immunization: Secondary | ICD-10-CM | POA: Diagnosis not present

## 2016-06-03 DIAGNOSIS — E785 Hyperlipidemia, unspecified: Secondary | ICD-10-CM | POA: Diagnosis not present

## 2016-06-03 DIAGNOSIS — M810 Age-related osteoporosis without current pathological fracture: Secondary | ICD-10-CM | POA: Diagnosis not present

## 2016-08-06 ENCOUNTER — Encounter: Payer: Medicare Other | Admitting: *Deleted

## 2016-08-07 ENCOUNTER — Telehealth: Payer: Self-pay | Admitting: Cardiology

## 2016-08-07 NOTE — Telephone Encounter (Signed)
Spoke with pt and reminded pt of remote transmission that is due today. Pt stated that she doesn't have a home monitor. Pt agreed to an appt w/ device clinic 11-04-16 at 9:00 AM.

## 2016-08-09 ENCOUNTER — Encounter: Payer: Self-pay | Admitting: Cardiology

## 2016-08-12 ENCOUNTER — Encounter: Payer: Self-pay | Admitting: Cardiology

## 2016-08-12 ENCOUNTER — Ambulatory Visit (INDEPENDENT_AMBULATORY_CARE_PROVIDER_SITE_OTHER): Payer: Medicare Other | Admitting: Cardiology

## 2016-08-12 VITALS — BP 169/80 | HR 74 | Ht 61.0 in | Wt 145.0 lb

## 2016-08-12 DIAGNOSIS — I10 Essential (primary) hypertension: Secondary | ICD-10-CM | POA: Diagnosis not present

## 2016-08-12 DIAGNOSIS — Z79899 Other long term (current) drug therapy: Secondary | ICD-10-CM

## 2016-08-12 DIAGNOSIS — Z950 Presence of cardiac pacemaker: Secondary | ICD-10-CM

## 2016-08-12 DIAGNOSIS — E7800 Pure hypercholesterolemia, unspecified: Secondary | ICD-10-CM

## 2016-08-12 NOTE — Patient Instructions (Addendum)
Medication Instructions:  Your physician recommends that you continue on your current medications as directed. Please refer to the Current Medication list given to you today.  If you need a refill on your cardiac medications before your next appointment, please call your pharmacy.  Labwork: LIPID, A1C, CMP TODAY AT [facility] ON THE 1ST FLOOR  Follow-Up: Your physician wants you to follow-up in: 6 MONTHS WITH DR Tresa EndoKELLY You will receive a reminder letter in the mail two months in advance. If you don't receive a letter, please call our office IN APRIL to schedule the follow-up appointment.  PACER CHECK 11-04-2016 @ 9AM  Special Instructions: PLEASE CALL IN ONE WEEK WITH BLOOD PRESSURE VALUES    Thank you for choosing [facility]!!    Marcelino DusterMichelle, LPN Nada BoozerLAURA INGOLD, NP

## 2016-08-12 NOTE — Progress Notes (Signed)
Cardiology Office Note   Date:  08/12/2016   ID:  Krista Ray, DOB [birth year], MRN [id number]  PCP:  Myrlene BrokerElizabeth A Crawford, MD  Cardiologist:  Dr. [facility]resa End[facility]Kelly [facility] Dr. Ruby ColaM. Croitoru     Chief Complaint  Patient presents with  . Hypertension    aortic sclerosis and valvular disease with TR and MR      History of Present Illness: Krista Ray is a 83 y.o. female who presents for HTN, with G1 DD, aortic sclerosis and mild to moderate MR and TR.  Last echo in 2011  She has a hx of infrequent paroxysmal complete heart block and received a dual-chamber permanent pacemaker returns for follow-up. Last interrogation in 05/07/16   "Interrogation of her dual-chamber St. Jude Accent (2011) pacemaker shows 16% atrial pacing and <1% ventricular pacing. The current generator has an estimated longevity of another 8-9 years. Since her last check there have been no episodes of atrial mode switch."  Today she feels well no chest pain or SOB and her BP is elevated but she tells me it is elevated on MD visits but at home 140-130 systolic, I did recheck and It was 150/80.  She is tired and would like to give up home and move into independent living.  Her family is not very supportive of this, she palns to discuss with them again.    She check her glucose every AM and she would like to stop this as well stop checking her BP       Past Medical History:  Diagnosis Date  . Chronic back pain   . Chronic neck pain   . Coronary artery disease   . DDD (degenerative disc disease), cervical   . DDD (degenerative disc disease), lumbar   . Hyperlipemia   . Hypertension   . MI (myocardial infarction)   . Presence of permanent cardiac pacemaker 07/17/2009   St.Jude  . Radiculopathy   . Sciatic nerve pain     Past Surgical History:  Procedure Laterality Date  . ABDOMINAL HYSTERECTOMY  1956  . APPENDECTOMY  1944  . BACK SURGERY  2004  . CATARACT EXTRACTION Right   . NM MYOCAR PERF WALL MOTION   09/06/2009   Normal  . PACEMAKER INSERTION  07/17/2009   left side-St.Jude  . US ECHOCARDIOGRAPHY  11/28/2011   Normal     Current Outpatient Prescriptions  Medication Sig Dispense Refill  . hydrochlorothiazide (MICROZIDE) 12.5 MG capsule Take 1 capsule as needed for swelling. 30 capsule 3  . latanoprost (XALATAN) 0.005 % ophthalmic solution Place 1 drop into both eyes at bedtime.   0  . metoprolol succinate (TOPROL-XL) 25 MG 24 hr tablet take 1 tablet by mouth once daily 90 tablet 0   No current facility-administered medications for this visit.     Allergies:   Penicillins; Lovastatin; and Pravastatin sodium    Social History:  The patient  reports that she has never smoked. She has never used smokeless tobacco. She reports that she does not drink alcohol or use drugs.   Family History:  The patient's family history includes Cancer in her brother; Heart disease in her father and sister; Hypertension in her father; Stroke in her father.    ROS:  General:no colds or fevers, no weight changes Skin:no rashes or ulcers HEENT:no blurred vision, no congestion CV:see HPI PUL:see HPI GI:no diarrhea constipation or melena, no indigestion GU:no hematuria, no dysuria MS:no joint pain, no claudication Neuro:no syncope, no lightheadedness Endo:no diabetes,  no thyroid disease  Wt Readings from Last 3 Encounters:  08/12/16 145 lb (65.8 kg)  05/07/16 147 lb (66.7 kg)  11/07/15 136 lb 3.2 oz (61.8 kg)     PHYSICAL EXAM: VS:  BP (!) 169/80   Pulse 74   Ht 5\' 1"  (1.549 m)   Wt 145 lb (65.8 kg)   BMI 27.40 kg/m  , BMI Body mass index is 27.4 kg/m. General:Pleasant affect, NAD Skin:Warm and dry, brisk capillary refill HEENT:normocephalic, sclera clear, mucus membranes moist Neck:supple, no JVD, no bruits  Heart:S1S2 RRR with soft 1/6 systolic murmur, no gallup, rub or click Lungs:clear without rales, rhonchi, or wheezes ZOX:WRUE, non tender, + BS, do not palpate liver spleen or  masses Ext:no lower ext edema, 2+ pedal pulses, 2+ radial pulses Neuro:alert and oriented X 3, MAE, follows commands, + facial symmetry  Recheck BP 150/80   EKG:  EKG is NOT ordered today.   Recent Labs: 10/23/2015: BUN 18; Creatinine, Ser 0.87; Hemoglobin 14.0; Platelets 224; Potassium 4.1; Sodium 144    Lipid Panel    Component Value Date/Time   CHOL 173 05/11/2015 0914   TRIG 65 05/11/2015 0914   HDL 75 05/11/2015 0914   CHOLHDL 2.3 05/11/2015 0914   VLDL 13 05/11/2015 0914   LDLCALC 85 05/11/2015 0914   LDLDIRECT 137.0 02/22/2013 1006       Other studies Reviewed: Additional studies/ records that were reviewed today include:  Echo 02/19/16 Study Conclusions  - Left ventricle: The cavity size was normal. Systolic function was   normal. The estimated ejection fraction was in the range of 60%   to 65%. Wall motion was normal; there were no regional wall   motion abnormalities. Doppler parameters are consistent with   abnormal left ventricular relaxation (grade 1 diastolic   dysfunction). Doppler parameters are consistent with high   ventricular filling pressure. - Aortic valve: Transvalvular velocity was within the normal range.   There was no stenosis. There was no regurgitation. - Mitral valve: There was trivial regurgitation. - Right ventricle: The cavity size was normal. Wall thickness was   normal. Systolic function was normal. - Tricuspid valve: There was trivial regurgitation. - Pulmonary arteries: Systolic pressure was within the normal   range. PA peak pressure: 25 mm Hg (S).  Carotid dopplers: IMPRESSION: Minor carotid atherosclerosis. No hemodynamically significant ICA stenosis. Degree of narrowing less than 50% bilaterally.  Patent antegrade vertebral flow bilaterally  ASSESSMENT AND PLAN:  1.  HTN elevated today, she will check daily and call to out office if stable she can decrease the BP check to weekly.   Will check CMP and lipids.   2.   Hyperglycemia will check hgb A1c and if normal then stop home accuchecks unless she does not feel well.   3. PPM she would like to obtain home monitor will ask pacer team to assist.       Follow up with Dr. Bishop Limbo in 6 months.   Current medicines are reviewed with the patient today.  The patient Has no concerns regarding medicines.  The following changes have been made:  See above Labs/ tests ordered today include:see above  Disposition:   FU:  see above  Signed, Nada Boozer, NP  08/12/2016 9:01 AM    [facility] [address] [place], [place], [place]  [address]/ [address] [employer] [address] [place], [place] Phone: [phone]; Fax: [phone]  [phone]

## 2016-08-15 DIAGNOSIS — I10 Essential (primary) hypertension: Secondary | ICD-10-CM | POA: Diagnosis not present

## 2016-08-15 DIAGNOSIS — Z79899 Other long term (current) drug therapy: Secondary | ICD-10-CM | POA: Diagnosis not present

## 2016-08-15 DIAGNOSIS — E7800 Pure hypercholesterolemia, unspecified: Secondary | ICD-10-CM | POA: Diagnosis not present

## 2016-08-15 LAB — HEMOGLOBIN A1C
Hgb A1c MFr Bld: 5.8 % — ABNORMAL HIGH (ref <5.7)
Mean Plasma Glucose: 120 mg/dL

## 2016-08-16 LAB — BASIC METABOLIC PANEL
BUN: 17 mg/dL (ref 7–25)
CHLORIDE: 104 mmol/L (ref 98–110)
CO2: 30 mmol/L (ref 20–31)
Calcium: 9.2 mg/dL (ref 8.6–10.4)
Creat: 1.05 mg/dL — ABNORMAL HIGH (ref 0.60–0.88)
Glucose, Bld: 97 mg/dL (ref 65–99)
Potassium: 4.6 mmol/L (ref 3.5–5.3)
Sodium: 144 mmol/L (ref 135–146)

## 2016-08-16 LAB — LIPID PANEL
CHOLESTEROL: 191 mg/dL (ref <200)
HDL: 62 mg/dL (ref >50)
LDL CALC: 112 mg/dL — AB (ref <100)
TRIGLYCERIDES: 84 mg/dL (ref <150)
Total CHOL/HDL Ratio: 3.1 Ratio (ref <5.0)
VLDL: 17 mg/dL (ref <30)

## 2016-08-20 ENCOUNTER — Other Ambulatory Visit: Payer: Self-pay | Admitting: Family Medicine

## 2016-08-20 DIAGNOSIS — Z1231 Encounter for screening mammogram for malignant neoplasm of breast: Secondary | ICD-10-CM

## 2016-09-27 ENCOUNTER — Ambulatory Visit
Admission: RE | Admit: 2016-09-27 | Discharge: 2016-09-27 | Disposition: A | Discharge status: Home / Self Care | Payer: Medicare Other | Source: Ambulatory Visit | Attending: Family Medicine | Admitting: Family Medicine

## 2016-09-27 DIAGNOSIS — Z1231 Encounter for screening mammogram for malignant neoplasm of breast: Secondary | ICD-10-CM | POA: Diagnosis not present

## 2016-10-07 ENCOUNTER — Telehealth: Payer: Self-pay | Admitting: Cardiology

## 2016-10-07 NOTE — Telephone Encounter (Signed)
New message   Pt calling requesting call, she would say what about. She is just asking for call.

## 2016-10-07 NOTE — Telephone Encounter (Signed)
Returned phone call to the patient. She wanted to verify where her appointment was on 4/23. I explained [facility] and she stated that she understood.

## 2016-10-07 NOTE — Telephone Encounter (Signed)
New message  Pt call requesting to speak with RN about giving some information to RN. Pt states she will wait to peak with RN. Please call back to discuss

## 2016-10-07 NOTE — Telephone Encounter (Signed)
Returned call to patient.She stated she was suppose to call and give Krista BoozerLaura Ingold NP some information after see saw her 08/12/16.Stated she did not want to give me the information.She wanted to speak to [employer] CommunicationsLaura Ray.Advised Krista BoozerLaura Ingold NP out of office this week.She still refused to give me information.Advised I will send message to her.

## 2016-10-25 ENCOUNTER — Telehealth: Payer: Self-pay | Admitting: Cardiology

## 2016-10-25 NOTE — Telephone Encounter (Signed)
Called pt back and she was stressed about PPM home monitoring device. She said someone called and kept telling her to check her porch & mailbox.    She could not find anything, pt was so upset her BP went very high.  Currently she is doing well.  Just still upset about this.  She feels someone was playing with her.  She understands that she has a [address] appt to check BP.

## 2016-10-25 NOTE — Telephone Encounter (Signed)
I called pt.  See separate note.

## 2016-11-04 ENCOUNTER — Ambulatory Visit (INDEPENDENT_AMBULATORY_CARE_PROVIDER_SITE_OTHER): Payer: Medicare Other | Admitting: *Deleted

## 2016-11-04 ENCOUNTER — Encounter: Payer: Self-pay | Admitting: Cardiovascular Disease

## 2016-11-04 DIAGNOSIS — I442 Atrioventricular block, complete: Secondary | ICD-10-CM

## 2016-11-04 LAB — CUP PACEART INCLINIC DEVICE CHECK
Battery Voltage: 2.92 V
Brady Statistic RA Percent Paced: 15 %
Brady Statistic RV Percent Paced: 0.15 %
Implantable Lead Implant Date: 20110103
Implantable Lead Implant Date: 20110103
Implantable Lead Location: 753860
Lead Channel Impedance Value: 537.5 Ohm
Lead Channel Pacing Threshold Amplitude: 0.5 V
Lead Channel Pacing Threshold Amplitude: 1 V
Lead Channel Pacing Threshold Pulse Width: 0.4 ms
Lead Channel Pacing Threshold Pulse Width: 0.4 ms
Lead Channel Pacing Threshold Pulse Width: 0.4 ms
Lead Channel Sensing Intrinsic Amplitude: 1.6 mV
Lead Channel Sensing Intrinsic Amplitude: 12 mV
Lead Channel Setting Pacing Amplitude: 1.5 V
Lead Channel Setting Sensing Sensitivity: 2.5 mV
MDC IDC LEAD LOCATION: 753859
MDC IDC MSMT LEADCHNL RA IMPEDANCE VALUE: 387.5 Ohm
MDC IDC MSMT LEADCHNL RA PACING THRESHOLD AMPLITUDE: 0.5 V
MDC IDC MSMT LEADCHNL RV PACING THRESHOLD AMPLITUDE: 1 V
MDC IDC MSMT LEADCHNL RV PACING THRESHOLD PULSEWIDTH: 0.4 ms
MDC IDC PG IMPLANT DT: 20110103
MDC IDC PG SERIAL: 7098440
MDC IDC SESS DTM: 20180423095915
MDC IDC SET LEADCHNL RV PACING AMPLITUDE: 1.125
MDC IDC SET LEADCHNL RV PACING PULSEWIDTH: 0.4 ms

## 2016-11-04 NOTE — Progress Notes (Signed)
Pacemaker check in clinic. Normal device function. Thresholds, sensing, impedances consistent with previous measurements. Device programmed to maximize longevity. 1 mode switch 09/19/15- previously reviewed. No high ventricular rates noted. Device programmed at appropriate safety margins. Histogram distribution appropriate for patient activity level. Device programmed to optimize intrinsic conduction. Estimated longevity 8.5-9.3 years. Patient enrolled in remote follow-up- paired with new inductive monitor (and cell adapter) today in clinic. Merlin 02/05/17, ROV with [facility] in October 2018.

## 2016-11-22 DIAGNOSIS — Z961 Presence of intraocular lens: Secondary | ICD-10-CM | POA: Diagnosis not present

## 2016-11-22 DIAGNOSIS — E119 Type 2 diabetes mellitus without complications: Secondary | ICD-10-CM | POA: Diagnosis not present

## 2016-11-22 DIAGNOSIS — H35033 Hypertensive retinopathy, bilateral: Secondary | ICD-10-CM | POA: Diagnosis not present

## 2016-11-22 DIAGNOSIS — H401131 Primary open-angle glaucoma, bilateral, mild stage: Secondary | ICD-10-CM | POA: Diagnosis not present

## 2016-11-25 ENCOUNTER — Telehealth: Payer: Self-pay | Admitting: Cardiovascular Disease

## 2016-11-25 NOTE — Telephone Encounter (Signed)
New message    Patient calling - only wants to see Dr. Tresa EndoKelly    Pt c/o swelling: STAT is pt has developed SOB within 24 hours  1. How long have you been experiencing swelling? A long time   2. Where is the swelling located? Feet   3.  Are you currently taking a "fluid pill"? Was told by MD not to take to many pills   4.  Are you currently SOB? No    5.  Have you traveled recently?no

## 2016-11-25 NOTE — Telephone Encounter (Signed)
Spoke with pt she states that she saw PA Milliganngold in Jan ans states that her feet/ankles have been swelling ever since she denies any other sx chest pain or pressure, nausea, sob, etc...she states that she doesn't weigh herself daily, she does take her BP daily and it has been running 140's/70's. Just wanted to let dr Tresa Endokelly know about the swelling. Taking HCTZ and metoprolol(PCP fills) daily as ordered. Please advise

## 2016-11-29 NOTE — Telephone Encounter (Signed)
Appt scheduled for 5-30 with TK

## 2016-11-29 NOTE — Telephone Encounter (Signed)
S/w pt she states that she is still having problems with LE swelling scheduled appt 12-11-16 @9am .  Is there anything else we can do to help with swelling until scheduled appt? Please advise

## 2016-11-29 NOTE — Telephone Encounter (Signed)
Per pt she states that just her bilat ankles are swollen and she states that her swelling does go down at night whe she is lying down she denies any SOB. Will await OV and see what Dr Tresa Endokelly has to say will call back if swelling worsens.

## 2016-11-29 NOTE — Telephone Encounter (Signed)
Unable to contact pt-phone just keeps ringine trying to schedule kelly appt monday

## 2016-12-02 NOTE — Telephone Encounter (Signed)
Returned call to patient to offer appt on 12/05/16 with Dr.Kelly in the AM. Patient states she already has an appointment. She will keep her appointment on 5/30

## 2016-12-11 ENCOUNTER — Ambulatory Visit (INDEPENDENT_AMBULATORY_CARE_PROVIDER_SITE_OTHER): Payer: Medicare Other | Admitting: Cardiovascular Disease

## 2016-12-11 ENCOUNTER — Encounter: Payer: Self-pay | Admitting: Cardiovascular Disease

## 2016-12-11 ENCOUNTER — Encounter (INDEPENDENT_AMBULATORY_CARE_PROVIDER_SITE_OTHER): Payer: Self-pay

## 2016-12-11 VITALS — BP 122/78 | HR 64 | Ht 61.0 in | Wt 147.2 lb

## 2016-12-11 DIAGNOSIS — E7800 Pure hypercholesterolemia, unspecified: Secondary | ICD-10-CM

## 2016-12-11 DIAGNOSIS — Z950 Presence of cardiac pacemaker: Secondary | ICD-10-CM

## 2016-12-11 DIAGNOSIS — I519 Heart disease, unspecified: Secondary | ICD-10-CM

## 2016-12-11 DIAGNOSIS — I10 Essential (primary) hypertension: Secondary | ICD-10-CM

## 2016-12-11 DIAGNOSIS — Z79899 Other long term (current) drug therapy: Secondary | ICD-10-CM

## 2016-12-11 DIAGNOSIS — I5189 Other ill-defined heart diseases: Secondary | ICD-10-CM

## 2016-12-11 DIAGNOSIS — E119 Type 2 diabetes mellitus without complications: Secondary | ICD-10-CM | POA: Diagnosis not present

## 2016-12-11 MED ORDER — HYDROCHLOROTHIAZIDE 12.5 MG PO CAPS: 12.5000 mg | ORAL_CAPSULE | Freq: Every day | ORAL | 3 refills | Status: DC

## 2016-12-11 MED ORDER — LISINOPRIL 2.5 MG PO TABS: 2.5000 mg | ORAL_TABLET | Freq: Every day | ORAL | 6 refills | Status: DC

## 2016-12-11 NOTE — Progress Notes (Addendum)
Patient ID: CURTISHA BENDIX, female   DOB: 05/04/1923, 83 y.o.   MRN: [id number]     HPI: AZAYLAH STAILEY is a 83 y.o. female who presents to the office today for a 2 1/2 year followup cardiology evaluation.  Ms. Averiana Clouatre developed third degree heart block and underwent permanent pacemaker insertion on 07/17/2009 with a [employer]. Jude's dual-chamber pacemaker. Additional problems include hypertension with grade 1 diastolic dysfunction, hyperlipidemia, aortic valve sclerosis, mild to moderate tricuspid and mitral regurgitation documented on echo Doppler study in 2011 as well as osteoarthritis and degenerative lumbar spine disease.  Remotely, a nuclear perfusion study in 2011 showed normal perfusion. Dr. Lovena Le had placed her pacemaker in 2011 and Dr. Recardo Evangelist has been following her pacemaker.  She tells her pacemaker was last interrogated approximately 3 months ago, and she had intact pacemaker function.  The pacemaker clinic evaluation from July 2015 showed estimated longevity is 10.6 years.   Apparently, I have not seen her since her October 2015 evaluation.  She was seen Dr. Sallyanne Kuster for her pacemaker interrogation on a yearly basis with his last evaluation in October 2017.  She saw Cecilie Kicks in January 2018 at which time she was hypertensive.  An echo Doppler study in August 2017 showed an EF of 60-65%.  There was grade 1 diastolic dysfunction.  Tissue Doppler was consistent with high ventricular filling pressure.  Estimated PA pressure was 25 mm.    She denies any episodes of chest pain.  She has experienced ankle edema, almost daily and has been taking HCTZ 12.5 mg every other day.  Apparently, she is now taking aspirin 81 mg twice a day and Toprol-XL 25 mg.  She presents for evaluation.    Past Medical History:  Diagnosis Date  . Chronic back pain   . Chronic neck pain   . Coronary artery disease   . DDD (degenerative disc disease), cervical   . DDD (degenerative disc disease), lumbar    . Hyperlipemia   . Hypertension   . MI (myocardial infarction) ([place])   . Presence of permanent cardiac pacemaker 07/17/2009   [employer].Jude  . Radiculopathy   . Sciatic nerve pain     Past Surgical History:  Procedure Laterality Date  . ABDOMINAL HYSTERECTOMY  1956  . APPENDECTOMY  1944  . BACK SURGERY  2004  . CATARACT EXTRACTION Right   . NM MYOCAR PERF WALL MOTION  09/06/2009   Normal  . PACEMAKER INSERTION  07/17/2009   left side-[employer].Jude  . US ECHOCARDIOGRAPHY  11/28/2011   Normal    Allergies  Allergen Reactions  . Penicillins     Unknown been years ago  . Lovastatin Nausea And Vomiting  . Pravastatin Sodium Nausea And Vomiting    Current Outpatient Prescriptions  Medication Sig Dispense Refill  . aspirin 81 MG tablet Take 1 tablet by mouth daily.    . hydrochlorothiazide (MICROZIDE) 12.5 MG capsule Take 1 capsule (12.5 mg total) by mouth daily. 30 capsule 3  . latanoprost (XALATAN) 0.005 % ophthalmic solution Place 1 drop into both eyes at bedtime.   0  . metoprolol succinate (TOPROL-XL) 25 MG 24 hr tablet take 1 tablet by mouth once daily 90 tablet 0  . lisinopril (PRINIVIL,ZESTRIL) 2.5 MG tablet Take 1 tablet (2.5 mg total) by mouth daily. 30 tablet 6   No current facility-administered medications for this visit.     Social History   Social History  . Marital status: Widowed    Spouse name:  N/A  . Number of children: N/A  . Years of education: N/A   Occupational History  . Not on file.   Social History Main Topics  . Smoking status: Never Smoker  . Smokeless tobacco: Never Used  . Alcohol use No  . Drug use: No  . Sexual activity: No   Other Topics Concern  . Not on file   Social History Narrative  . No narrative on file    Family History  Problem Relation Age of Onset  . Heart disease Father   . Hypertension Father   . Stroke Father   . Heart disease Sister   . Cancer Brother   . Diabetes Neg Hx    ROS General: Negative; No fevers, chills,  or night sweats;  HEENT: Negative; No changes in vision or hearing, sinus congestion, difficulty swallowing Pulmonary: Negative; No cough, wheezing, shortness of breath, hemoptysis Cardiovascular: Negative; No chest pain, presyncope, syncope, palpitations Positive for ankle swelling GI: Negative; No nausea, vomiting, diarrhea, or abdominal pain GU: Negative; No dysuria, hematuria, or difficulty voiding Musculoskeletal: Negative; no myalgias, joint pain, or weakness Hematologic/Oncology: Negative; no easy bruising, bleeding Endocrine: Positive for diabetes, diagnosed this past  year; no heat/cold intolerance;  Neuro: Positive for issues with sciatica; no changes in balance, headaches Skin: Negative; No rashes or skin lesions Psychiatric: Negative; No behavioral problems, depression Sleep: Negative; No snoring, daytime sleepiness, hypersomnolence, bruxism, restless legs, hypnogognic hallucinations, no cataplexy Other comprehensive 14 point system review is negative.   Physical Exam BP 122/78   Pulse 64   Ht '5\' 1"'  (1.549 m)   Wt 147 lb 3.2 oz (66.8 kg)   BMI 27.81 kg/m    Repeat blood pressure by me was 170/80.  She tells me her blood pressures at home have typically been running in the 010-932 systolic range.  Wt Readings from Last 3 Encounters:  12/11/16 147 lb 3.2 oz (66.8 kg)  08/12/16 145 lb (65.8 kg)  05/07/16 147 lb (66.7 kg)   General: Alert, oriented, no distress.  Skin: normal turgor, no rashes, warm and dry HEENT: Normocephalic, atraumatic. Pupils equal round and reactive to light; sclera anicteric; extraocular muscles intact; there is bilateral arcus senilis Nose without nasal septal hypertrophy Mouth/Parynx benign; Mallinpatti scale 3 Neck: No JVD, no carotid bruits; normal carotid upstroke Lungs: clear to ausculatation and percussion; no wheezing or rales Chest wall: without tenderness to palpitation Heart: PMI not displaced, RRR, s1 s2 normal, 2/6 systolic murmur,  no diastolic murmur, no rubs, gallops, thrills, or heaves Abdomen: soft, nontender; no hepatosplenomehaly, BS+; abdominal aorta nontender and not dilated by palpation. Back: no CVA tenderness Pulses 2+ Musculoskeletal: full range of motion, normal strength, no joint deformities Extremities: Trace to 1+ ankle swelling ; no clubbing cyanosis , Homan's sign negative  Neurologic: grossly nonfocal; Cranial nerves grossly wnl Psychologic: Normal mood and affect   ECG (independently read by me): Normal sinus rhythm at 64 bpm.  There is one paced atrial beat, otherwise all beats are appropriately sensed. PR interval 140 ms.  QTc interval 468 ms.  Right bundle-branch block.  ECG (independently read by me): 100% atrially paced with ventricular sensing.  Heart rate 61 beats per minute.  Right bundle branch block.  Prior ECG: Normal sinus rhythm with incomplete right bundle branch block. PR interval 128 ms. Most of the time she appears to be atrial and ventricular sensing but she did have several atrial paced beats.  LABS:  BMP Latest Ref Rng & Units 08/15/2016  10/23/2015 05/11/2015  Glucose 65 - 99 mg/dL 97 144(H) 89  BUN 7 - 25 mg/dL '17 18 21  ' Creatinine 0.60 - 0.88 mg/dL 1.05(H) 0.87 0.91(H)  Sodium 135 - 146 mmol/L 144 144 142  Potassium 3.5 - 5.3 mmol/L 4.6 4.1 4.7  Chloride 98 - 110 mmol/L 104 106 105  CO2 20 - 31 mmol/L '30 29 31  ' Calcium 8.6 - 10.4 mg/dL 9.2 9.4 9.3  \ Hepatic Function Latest Ref Rng & Units 05/11/2015 01/10/2015 05/02/2014  Total Protein 6.1 - 8.1 g/dL 6.6 7.3 7.0  Albumin 3.6 - 5.1 g/dL 3.7 3.9 4.1  AST 10 - 35 U/L '22 24 23  ' ALT 6 - 29 U/L '21 24 19  ' Alk Phosphatase 33 - 130 U/L 77 74 71  Total Bilirubin 0.2 - 1.2 mg/dL 0.9 0.7 0.8  Bilirubin, Direct 0.0 - 0.3 mg/dL - - -   CBC Latest Ref Rng & Units 10/23/2015 05/02/2014 02/22/2013  WBC 4.0 - 10.5 K/uL 5.6 4.4 6.6  Hemoglobin 12.0 - 15.0 g/dL 14.0 14.8 15.4(H)  Hematocrit 36.0 - 46.0 % 44.4 44.1 46.2(H)  Platelets 150  - 400 K/uL 224 238 247.0   Lab Results  Component Value Date   MCV 97.4 10/23/2015   MCV 92.3 05/02/2014   MCV 93.8 02/22/2013   Lab Results  Component Value Date   TSH 0.86 01/10/2015   Lab Results  Component Value Date   HGBA1C 5.8 (H) 08/15/2016   Lipid Panel     Component Value Date/Time   CHOL 191 08/15/2016 0956   TRIG 84 08/15/2016 0956   HDL 62 08/15/2016 0956   CHOLHDL 3.1 08/15/2016 0956   VLDL 17 08/15/2016 0956   LDLCALC 112 (H) 08/15/2016 0956   LDLDIRECT 137.0 02/22/2013 1006      RADIOLOGY: No results found.  IMPRESSION: 1. Essential hypertension   2. Diastolic dysfunction   3. Pacemaker   4. Type 2 diabetes mellitus without complication, without long-term current use of insulin ([place])   5. Pure hypercholesterolemia   6. Medication management     ASSESSMENT AND PLAN: Ms. Condon is a 83 year old African-American female who developed complete heart block on 07/15/2009 when she presented with a ventricular rate in the 30s. At that time I placed a temporary pacemaker and on July 17, 2009 she underwent permanent pacemaker insertion with a [employer]. Jude's dual-chamber pacemaker.  I have not seen her in over 2-1/2 years.  She has been undergoing yearly pacemaker interrogation.  Apparently, she now is able to have telephonic assessment at home.  Her blood pressure today is elevated despite being on Toprol-XL 25 mg and HCTZ 12.5 mg every other day.  She also has ankle swelling.  I have recommended she increase hydrochlorothiazide to 12.5 g daily.  I'm adding lisinopril 2.5 mg per medical regimen and she will continue with her current dose of Toprol.  When I saw her, she was on metformin for type 2 diabetes mellitus.  It does not appear that she is on therapy presently. Her last hemoglobin A1c in February 2018 was 5.8.  She is predominantly sensing on ECG today, but had one atrial paced beat.  She has not had recent fasting laboratory.  Her echo Doppler data from August  2017 was reviewed.  She had grade 1 diastolic function with abnormal tissue Doppler with normal systolic function.  She will undergo complete set of fasting laboratory in several weeks.  I will see her in 2 months for follow-up evaluation.  We will schedule her for a one year pacemaker follow-up with Dr. Sallyanne Kuster in October 2018.  Troy Sine, MD, [facility]  12/11/2016 9:15 AM

## 2016-12-11 NOTE — Patient Instructions (Addendum)
Your physician has recommended you make the following change in your medication:   1.) start new prescription for Lisinopril 2.5 mg This has [facility].  2.) the aspirin 81 mg has been decreased to 1 daily.  3.) the HCTZ ( fluid pill) has been changed to take 1 tablet daily.   Your physician recommends that you return for lab work in: 2-3 weeks.    Your physician recommends that you schedule a follow-up appointment with Dr Royann Shiversroitoru in October for your pacemaker. See Dr Tresa EndoKelly in August.

## 2016-12-23 ENCOUNTER — Other Ambulatory Visit: Payer: Self-pay | Admitting: *Deleted

## 2016-12-23 DIAGNOSIS — I10 Essential (primary) hypertension: Secondary | ICD-10-CM | POA: Diagnosis not present

## 2016-12-23 DIAGNOSIS — Z794 Long term (current) use of insulin: Secondary | ICD-10-CM | POA: Diagnosis not present

## 2016-12-23 DIAGNOSIS — Z79899 Other long term (current) drug therapy: Secondary | ICD-10-CM | POA: Diagnosis not present

## 2016-12-23 DIAGNOSIS — E7800 Pure hypercholesterolemia, unspecified: Secondary | ICD-10-CM | POA: Diagnosis not present

## 2016-12-23 DIAGNOSIS — E119 Type 2 diabetes mellitus without complications: Secondary | ICD-10-CM | POA: Diagnosis not present

## 2016-12-23 MED ORDER — HYDROCHLOROTHIAZIDE 12.5 MG PO CAPS: 12.5000 mg | ORAL_CAPSULE | Freq: Every day | ORAL | 1 refills | Status: DC

## 2016-12-24 LAB — COMPREHENSIVE METABOLIC PANEL
A/G RATIO: 1.3 (ref 1.2–2.2)
ALT: 18 IU/L (ref 0–32)
AST: 23 IU/L (ref 0–40)
Albumin: 4 g/dL (ref 3.2–4.6)
Alkaline Phosphatase: 70 IU/L (ref 39–117)
BUN/Creatinine Ratio: 22 (ref 12–28)
BUN: 19 mg/dL (ref 10–36)
Bilirubin Total: 0.6 mg/dL (ref 0.0–1.2)
CALCIUM: 9.7 mg/dL (ref 8.7–10.3)
CO2: 26 mmol/L (ref 20–29)
CREATININE: 0.87 mg/dL (ref 0.57–1.00)
Chloride: 99 mmol/L (ref 96–106)
GFR, EST AFRICAN AMERICAN: 67 mL/min/{1.73_m2} (ref >59)
GFR, EST NON AFRICAN AMERICAN: 58 mL/min/{1.73_m2} — AB (ref >59)
GLOBULIN, TOTAL: 3.2 g/dL (ref 1.5–4.5)
Glucose: 94 mg/dL (ref 65–99)
Potassium: 4.6 mmol/L (ref 3.5–5.2)
Sodium: 140 mmol/L (ref 134–144)
TOTAL PROTEIN: 7.2 g/dL (ref 6.0–8.5)

## 2016-12-24 LAB — CBC
HEMATOCRIT: 44.5 % (ref 34.0–46.6)
Hemoglobin: 14.7 g/dL (ref 11.1–15.9)
MCH: 30.3 pg (ref 26.6–33.0)
MCHC: 33 g/dL (ref 31.5–35.7)
MCV: 92 fL (ref 79–97)
PLATELETS: 241 10*3/uL (ref 150–379)
RBC: 4.85 x10E6/uL (ref 3.77–5.28)
RDW: 13.6 % (ref 12.3–15.4)
WBC: 4.6 10*3/uL (ref 3.4–10.8)

## 2016-12-24 LAB — LIPID PANEL
Chol/HDL Ratio: 3.2 ratio (ref 0.0–4.4)
Cholesterol, Total: 234 mg/dL — ABNORMAL HIGH (ref 100–199)
HDL: 73 mg/dL
LDL Calculated: 142 mg/dL — ABNORMAL HIGH (ref 0–99)
Triglycerides: 93 mg/dL (ref 0–149)
VLDL Cholesterol Cal: 19 mg/dL (ref 5–40)

## 2016-12-24 LAB — HEMOGLOBIN A1C
ESTIMATED AVERAGE GLUCOSE: 126 mg/dL
Hgb A1c MFr Bld: 6 % — ABNORMAL HIGH (ref 4.8–5.6)

## 2016-12-24 LAB — TSH: TSH: 1.43 u[IU]/mL (ref 0.450–4.500)

## 2017-01-13 DIAGNOSIS — Z1389 Encounter for screening for other disorder: Secondary | ICD-10-CM | POA: Diagnosis not present

## 2017-01-13 DIAGNOSIS — R7303 Prediabetes: Secondary | ICD-10-CM | POA: Diagnosis not present

## 2017-01-13 DIAGNOSIS — M810 Age-related osteoporosis without current pathological fracture: Secondary | ICD-10-CM | POA: Diagnosis not present

## 2017-01-13 DIAGNOSIS — Z0000 Encounter for general adult medical examination without abnormal findings: Secondary | ICD-10-CM | POA: Diagnosis not present

## 2017-01-13 DIAGNOSIS — I10 Essential (primary) hypertension: Secondary | ICD-10-CM | POA: Diagnosis not present

## 2017-02-05 ENCOUNTER — Telehealth: Payer: Self-pay | Admitting: Cardiology

## 2017-02-05 ENCOUNTER — Ambulatory Visit (INDEPENDENT_AMBULATORY_CARE_PROVIDER_SITE_OTHER): Payer: Medicare Other | Admitting: *Deleted

## 2017-02-05 DIAGNOSIS — I442 Atrioventricular block, complete: Secondary | ICD-10-CM | POA: Diagnosis not present

## 2017-02-05 NOTE — Telephone Encounter (Signed)
Spoke with pt and reminded pt of remote transmission that is due today. Pt verbalized understanding.   

## 2017-02-05 NOTE — Progress Notes (Signed)
Remote pacemaker transmission.   

## 2017-02-06 ENCOUNTER — Encounter: Payer: Self-pay | Admitting: Cardiology

## 2017-02-19 ENCOUNTER — Telehealth: Payer: Self-pay | Admitting: *Deleted

## 2017-02-19 ENCOUNTER — Encounter: Payer: Self-pay | Admitting: *Deleted

## 2017-02-19 NOTE — Telephone Encounter (Signed)
Called to give lab results and recommendations. No answer or machine. Letter will be sent to patient.

## 2017-02-19 NOTE — Telephone Encounter (Signed)
-----   Message from Lennette Biharihomas A Kelly, MD sent at 01/31/2017 11:42 AM EDT ----- Labs reviewed.  Patient is 83 years old.  Hemoglobin A1c is slightly increased at 6.  Lipids are elevated.  Try to improve diet.  In the past the patient did not tolerate Pravachol.  Consider a trial of Zetia or significantly improved dietary control

## 2017-02-21 ENCOUNTER — Encounter: Payer: Self-pay | Admitting: Cardiovascular Disease

## 2017-02-21 ENCOUNTER — Ambulatory Visit (INDEPENDENT_AMBULATORY_CARE_PROVIDER_SITE_OTHER): Payer: Medicare Other | Admitting: Cardiovascular Disease

## 2017-02-21 VITALS — BP 148/70 | HR 64 | Ht 61.0 in | Wt 145.6 lb

## 2017-02-21 DIAGNOSIS — Z950 Presence of cardiac pacemaker: Secondary | ICD-10-CM

## 2017-02-21 DIAGNOSIS — I519 Heart disease, unspecified: Secondary | ICD-10-CM | POA: Diagnosis not present

## 2017-02-21 DIAGNOSIS — M25476 Effusion, unspecified foot: Secondary | ICD-10-CM

## 2017-02-21 DIAGNOSIS — M25475 Effusion, left foot: Secondary | ICD-10-CM

## 2017-02-21 DIAGNOSIS — M25473 Effusion, unspecified ankle: Secondary | ICD-10-CM | POA: Diagnosis not present

## 2017-02-21 DIAGNOSIS — I10 Essential (primary) hypertension: Secondary | ICD-10-CM

## 2017-02-21 DIAGNOSIS — I5189 Other ill-defined heart diseases: Secondary | ICD-10-CM

## 2017-02-21 DIAGNOSIS — M25474 Effusion, right foot: Secondary | ICD-10-CM

## 2017-02-21 DIAGNOSIS — Z79899 Other long term (current) drug therapy: Secondary | ICD-10-CM

## 2017-02-21 MED ORDER — HYDROCHLOROTHIAZIDE 12.5 MG PO CAPS: ORAL_CAPSULE | ORAL | 3 refills | Status: DC

## 2017-02-21 NOTE — Patient Instructions (Signed)
Medication Instructions:  Take 25mg  HCTZ Saturday and Sunday.  If swelling still present, take 25 mg on Monday.  Starting Tuesday, alternate days taking 25mg  (2 tablets) and 12.5 mg (1 tablet) of HCTZ.  Labwork: Please return for blood work in 2 weeks (BMET).  Follow-Up: Your physician recommends that you schedule a follow-up appointment in: 3-4 months with Dr. Tresa EndoKelly.    Any Other Special Instructions Will Be Listed Below (If Applicable).     If you need a refill on your cardiac medications before your next appointment, please call your pharmacy.

## 2017-02-21 NOTE — Progress Notes (Signed)
Patient ID: Krista Ray, female   DOB: December 28, 1922, 83 y.o.   MRN: [id number]     HPI: Krista Ray is a 83 y.o. female who presents to the office today for a 3 month followup cardiology evaluation.  [name] developed third degree heart block and underwent permanent pacemaker insertion on 07/17/2009 with a St. Jude's dual-chamber pacemaker. Additional problems include hypertension with grade 1 diastolic dysfunction, hyperlipidemia, aortic valve sclerosis, mild to moderate tricuspid and mitral regurgitation documented on echo Doppler study in 2011 as well as osteoarthritis and degenerative lumbar spine disease.  Remotely, a nuclear perfusion study in 2011 showed normal perfusion. Dr. Lovena Le had placed her pacemaker in 2011 and Dr. Recardo Evangelist has been following her pacemaker.  She tells her pacemaker was last interrogated approximately 3 months ago, and she had intact pacemaker function.  The pacemaker clinic evaluation from July 2015 showed estimated longevity is 10.6 years.   Apparently, I have not seen her since her October 2015 evaluation.  She was seen Dr. Sallyanne Kuster for her pacemaker interrogation on a yearly basis with his last evaluation in October 2017.  She saw Cecilie Kicks in January 2018 at which time she was hypertensive.  An echo Doppler study in August 2017 showed an EF of 60-65%.  There was grade 1 diastolic dysfunction.  Tissue Doppler was consistent with high ventricular filling pressure.  Estimated PA pressure was 25 mm.    Since I last saw her 3 months ago, she denies any chest pain.  She admits to ankle swelling bilaterally.  She is unaware of palpitations.  She had lab work which showed a TSH normal at 1.43.  Her hemoglobin and hematocrit were stable.  Renal function was stable with a BUN of 19, creatinine 0.87.  She had normal LFTs.  She has hyperlipidemia and her recent LDL had increased to 142.  She is without chest pain.  She denies presyncope or syncope.    Past  Medical History:  Diagnosis Date  . Chronic back pain   . Chronic neck pain   . Coronary artery disease   . DDD (degenerative disc disease), cervical   . DDD (degenerative disc disease), lumbar   . Hyperlipemia   . Hypertension   . MI (myocardial infarction) ([place])   . Presence of permanent cardiac pacemaker 07/17/2009   St.Jude  . Radiculopathy   . Sciatic nerve pain     Past Surgical History:  Procedure Laterality Date  . ABDOMINAL HYSTERECTOMY  1956  . APPENDECTOMY  1944  . BACK SURGERY  2004  . CATARACT EXTRACTION Right   . NM MYOCAR PERF WALL MOTION  09/06/2009   Normal  . PACEMAKER INSERTION  07/17/2009   left side-[employer]  . US ECHOCARDIOGRAPHY  11/28/2011   Normal    Allergies  Allergen Reactions  . Penicillins     Unknown been years ago  . Lovastatin Nausea And Vomiting  . Pravastatin Sodium Nausea And Vomiting    Current Outpatient Prescriptions  Medication Sig Dispense Refill  . aspirin 81 MG tablet Take 1 tablet by mouth daily.    . hydrochlorothiazide (MICROZIDE) 12.5 MG capsule Alternate taking 18m and 12.5 mg every other day. 135 capsule 3  . latanoprost (XALATAN) 0.005 % ophthalmic solution Place 1 drop into both eyes at bedtime.   0  . lisinopril (PRINIVIL,ZESTRIL) 2.5 MG tablet Take 1 tablet (2.5 mg total) by mouth daily. 30 tablet 6  . metoprolol succinate (TOPROL-XL) 25 MG 24 hr tablet  take 1 tablet by mouth once daily 90 tablet 0   No current facility-administered medications for this visit.     Social History   Social History  . Marital status: Widowed    Spouse name: N/A  . Number of children: N/A  . Years of education: N/A   Occupational History  . Not on file.   Social History Main Topics  . Smoking status: Never Smoker  . Smokeless tobacco: Never Used  . Alcohol use No  . Drug use: No  . Sexual activity: No   Other Topics Concern  . Not on file   Social History Narrative  . No narrative on file    Family History  Problem  Relation Age of Onset  . Heart disease Father   . Hypertension Father   . Stroke Father   . Heart disease Sister   . Cancer Brother   . Diabetes Neg Hx    ROS General: Negative; No fevers, chills, or night sweats;  HEENT: Negative; No changes in vision or hearing, sinus congestion, difficulty swallowing Pulmonary: Negative; No cough, wheezing, shortness of breath, hemoptysis Cardiovascular: Negative; No chest pain, presyncope, syncope, palpitations Positive for ankle swelling GI: Negative; No nausea, vomiting, diarrhea, or abdominal pain GU: Negative; No dysuria, hematuria, or difficulty voiding Musculoskeletal: Negative; no myalgias, joint pain, or weakness Hematologic/Oncology: Negative; no easy bruising, bleeding Endocrine: Positive for diabetes, diagnosed this past  year; no heat/cold intolerance;  Neuro: Positive for issues with sciatica; no changes in balance, headaches Skin: Negative; No rashes or skin lesions Psychiatric: Negative; No behavioral problems, depression Sleep: Negative; No snoring, daytime sleepiness, hypersomnolence, bruxism, restless legs, hypnogognic hallucinations, no cataplexy Other comprehensive 14 point system review is negative.   Physical Exam BP (!) 148/70   Pulse 64   Ht 5' 1" (1.549 m)   Wt 145 lb 9.6 oz (66 kg)   BMI 27.51 kg/m    Repeat blood pressure by me was significantly improved from her last evaluation and was now 146/70.  Wt Readings from Last 3 Encounters:  02/21/17 145 lb 9.6 oz (66 kg)  12/11/16 147 lb 3.2 oz (66.8 kg)  08/12/16 145 lb (65.8 kg)   General: Alert, oriented, no distress.  Skin: normal turgor, no rashes, warm and dry HEENT: Normocephalic, atraumatic. Pupils equal round and reactive to light; sclera anicteric; extraocular muscles intact; bilateral arcus senilis Nose without nasal septal hypertrophy Mouth/Parynx benign; Mallinpatti scale 3 Neck: No JVD, no carotid bruits; normal carotid upstroke Lungs: clear to  ausculatation and percussion; no wheezing or rales Chest wall: without tenderness to palpitation Heart: PMI not displaced, RRR, s1 s2 normal, 1/6 systolic murmur, no diastolic murmur, no rubs, gallops, thrills, or heaves Abdomen: soft, nontender; no hepatosplenomehaly, BS+; abdominal aorta nontender and not dilated by palpation. Back: no CVA tenderness Pulses 2+ Musculoskeletal: full range of motion, normal strength, no joint deformities Extremities: Bilateral ankle swelling;no clubbing cyanosis or edema, Homan's sign negative  Neurologic: grossly nonfocal; Cranial nerves grossly wnl Psychologic: Normal mood and affect   ECG (independently read by me): Sinus rhythm at 64 bpm with PAC.  Nonspecific conduction delay.  QTc interval 42 ms.  May 2018 ECG (independently read by me): Normal sinus rhythm at 64 bpm.  There is one paced atrial beat, otherwise all beats are appropriately sensed. PR interval 140 ms.  QTc interval 468 ms.  Right bundle-branch block.  ECG (independently read by me): 100% atrially paced with ventricular sensing.  Heart rate 61 beats per  minute.  Right bundle branch block.  Prior ECG: Normal sinus rhythm with incomplete right bundle branch block. PR interval 128 ms. Most of the time she appears to be atrial and ventricular sensing but she did have several atrial paced beats.  LABS:  BMP Latest Ref Rng & Units 12/23/2016 08/15/2016 10/23/2015  Glucose 65 - 99 mg/dL 94 97 144(H)  BUN 10 - 36 mg/dL _0 Creatinine 0.57 - 1.00 mg/dL 0.87 1.05(H) 0.87  BUN/Creat Ratio 12 - 28 22 - -  Sodium 134 - 144 mmol/L 140 144 144  Potassium 3.5 - 5.2 mmol/L 4.6 4.6 4.1  Chloride 96 - 106 mmol/L 99 104 106  CO2 20 - 29 mmol/L _1 Calcium 8.7 - 10.3 mg/dL 9.7 9.2 9.4  _2 Hepatic Function Latest Ref Rng & Units 12/23/2016 05/11/2015 01/10/2015  Total Protein 6.0 - 8.5 g/dL 7.2 6.6 7.3  Albumin 3.2 - 4.6 g/dL 4.0 3.7 3.9  AST 0 - 40 IU/L _3 ALT 0 - 32 IU/L _4 Alk  Phosphatase 39 - 117 IU/L 70 77 74  Total Bilirubin 0.0 - 1.2 mg/dL 0.6 0.9 0.7  Bilirubin, Direct 0.0 - 0.3 mg/dL - - -   CBC Latest Ref Rng & Units 12/23/2016 10/23/2015 05/02/2014  WBC 3.4 - 10.8 x10E3/uL 4.6 5.6 4.4  Hemoglobin 11.1 - 15.9 g/dL 14.7 14.0 14.8  Hematocrit 34.0 - 46.6 % 44.5 44.4 44.1  Platelets 150 - 379 x10E3/uL 241 224 238   Lab Results  Component Value Date   MCV 92 12/23/2016   MCV 97.4 10/23/2015   MCV 92.3 05/02/2014   Lab Results  Component Value Date   TSH 1.430 12/23/2016   Lab Results  Component Value Date   HGBA1C 6.0 (H) 12/23/2016   Lipid Panel     Component Value Date/Time   CHOL 234 (H) 12/23/2016 0815   TRIG 93 12/23/2016 0815   HDL 73 12/23/2016 0815   CHOLHDL 3.2 12/23/2016 0815   CHOLHDL 3.1 08/15/2016 0956   VLDL 17 08/15/2016 0956   LDLCALC 142 (H) 12/23/2016 0815   LDLDIRECT 137.0 02/22/2013 1006      RADIOLOGY: No results found.  IMPRESSION: 1. Essential hypertension   2. Medication management   3. Diastolic dysfunction   4. Pacemaker   5. Bilateral swelling of feet and ankles     ASSESSMENT AND PLAN: Ms. Ray is a 83 year old African-American female who developed complete heart block on 07/15/2009 when she presented with a ventricular rate in the 30s. At that time I placed a temporary pacemaker and on July 17, 2009 she underwent permanent pacemaker insertion with a St. Jude's dual-chamber pacemaker. She has been undergoing yearly pacemaker interrogation.  I had not seen her in over 2-1/2 years prior her last evaluation 3 months ago.  Her echo Doppler study from August 2017 was reviewed and she had grade 1 diastolic dysfunction with abnormal tissue Doppler and normal systolic function.  At that time, she was significantly hypertensive and had ankle edema.  I initiated lisinopril and started HCTZ 12.5 mg.  She has felt improved on this regimen.  Her blood pressure today is significantly better but still elevated and she  continues to have some ankle swelling.  I have recommended that she increase her hydrochlorothiazide to 25 mg for the next 2 days and then alternate 25 and 12.5 mg every other day.  If she continues to have edema on the low  dose.  They she will increase her HCTZ to 25 mg daily.  Laboratory in June showed a creatinine of 0.87.  She is 83 years old.  I discussed her most recent lipid studies.  We discussed diet.  At present she is not on lipid-lowering therapy.  She denies any chest pain or anginal symptoms.  She denies any presyncope or syncope or issues with balance.  I will see her in 3-4 months for cardiology reevaluation.  Troy Sine, MD, [facility]  02/22/2017 1:09 PM

## 2017-02-28 ENCOUNTER — Telehealth: Payer: Self-pay | Admitting: Cardiovascular Disease

## 2017-02-28 MED ORDER — EZETIMIBE 10 MG PO TABS: 10.0000 mg | ORAL_TABLET | Freq: Every day | ORAL | 2 refills | Status: DC

## 2017-02-28 NOTE — Telephone Encounter (Signed)
Please call,pt calling about her new medicine(Ezetimibe.

## 2017-02-28 NOTE — Telephone Encounter (Signed)
Mrs.Colasanti is calling because she received a letter about her labs and starting a new medication . Please Call .Marland Kitchen Thanks

## 2017-02-28 NOTE — Telephone Encounter (Signed)
Per letter sent: We have unsuccessfully attempted to reach you by phone. The above labs were reviewed by Dr Tresa Endo. He reports your hemoglobin A1c is slightly elevated. Also your lipids are elevated as well. He recommends that you modify your diet. He also recommends that since you are unable to take statins to control this, that you try taking ezetimibe ( generic for zetia) which is not a statin. If you agree to trying this please contact our office for a prescription. Otherwise he recommends diet modification.   S/w [facility] S[facility]w Dr Tresa Endo start Zetia 10mg -rx sent as requested

## 2017-03-04 DIAGNOSIS — Z23 Encounter for immunization: Secondary | ICD-10-CM | POA: Diagnosis not present

## 2017-03-06 DIAGNOSIS — Z79899 Other long term (current) drug therapy: Secondary | ICD-10-CM | POA: Diagnosis not present

## 2017-03-06 LAB — BASIC METABOLIC PANEL
BUN/Creatinine Ratio: 23 (ref 12–28)
BUN: 23 mg/dL (ref 10–36)
CALCIUM: 9.6 mg/dL (ref 8.7–10.3)
CO2: 25 mmol/L (ref 20–29)
CREATININE: 0.98 mg/dL (ref 0.57–1.00)
Chloride: 101 mmol/L (ref 96–106)
GFR calc Af Amer: 58 mL/min/{1.73_m2} — ABNORMAL LOW (ref >59)
GFR, EST NON AFRICAN AMERICAN: 50 mL/min/{1.73_m2} — AB (ref >59)
Glucose: 87 mg/dL (ref 65–99)
Potassium: 4.3 mmol/L (ref 3.5–5.2)
SODIUM: 142 mmol/L (ref 134–144)

## 2017-03-06 LAB — CUP PACEART REMOTE DEVICE CHECK
Battery Remaining Percentage: 65 %
Brady Statistic AP VS Percent: 18 %
Brady Statistic AS VP Percent: 1 %
Brady Statistic RA Percent Paced: 17 %
Brady Statistic RV Percent Paced: 1 %
Date Time Interrogation Session: 20180725113317
Implantable Lead Implant Date: 20110103
Implantable Lead Location: 753859
Lead Channel Impedance Value: 380 Ohm
Lead Channel Impedance Value: 490 Ohm
Lead Channel Pacing Threshold Amplitude: 0.75 V
Lead Channel Sensing Intrinsic Amplitude: 8.1 mV
Lead Channel Setting Pacing Amplitude: 1.5 V
Lead Channel Setting Pacing Pulse Width: 0.4 ms
Lead Channel Setting Sensing Sensitivity: 2.5 mV
MDC IDC LEAD IMPLANT DT: 20110103
MDC IDC LEAD LOCATION: 753860
MDC IDC MSMT BATTERY REMAINING LONGEVITY: 94 mo
MDC IDC MSMT BATTERY VOLTAGE: 2.9 V
MDC IDC MSMT LEADCHNL RA PACING THRESHOLD AMPLITUDE: 0.5 V
MDC IDC MSMT LEADCHNL RA PACING THRESHOLD PULSEWIDTH: 0.4 ms
MDC IDC MSMT LEADCHNL RA SENSING INTR AMPL: 1.5 mV
MDC IDC MSMT LEADCHNL RV PACING THRESHOLD PULSEWIDTH: 0.4 ms
MDC IDC PG IMPLANT DT: 20110103
MDC IDC SET LEADCHNL RV PACING AMPLITUDE: 1 V
MDC IDC STAT BRADY AP VP PERCENT: 1 %
MDC IDC STAT BRADY AS VS PERCENT: 82 %
Pulse Gen Model: 2110
Pulse Gen Serial Number: 7098440

## 2017-05-07 ENCOUNTER — Ambulatory Visit (INDEPENDENT_AMBULATORY_CARE_PROVIDER_SITE_OTHER): Payer: Medicare Other | Admitting: *Deleted

## 2017-05-07 ENCOUNTER — Telehealth: Payer: Self-pay | Admitting: Cardiology

## 2017-05-07 DIAGNOSIS — I442 Atrioventricular block, complete: Secondary | ICD-10-CM

## 2017-05-07 NOTE — Telephone Encounter (Signed)
Attempted to confirm remote transmission with pt. No answer and was unable to leave a message.   

## 2017-05-08 NOTE — Progress Notes (Signed)
Remote pacemaker transmission.   

## 2017-05-09 ENCOUNTER — Encounter: Payer: Self-pay | Admitting: Cardiology

## 2017-05-09 LAB — CUP PACEART REMOTE DEVICE CHECK
Battery Remaining Longevity: 94 mo
Battery Remaining Percentage: 65 %
Brady Statistic AP VS Percent: 15 %
Brady Statistic AS VP Percent: 22 %
Brady Statistic AS VS Percent: 54 %
Implantable Lead Location: 753860
Lead Channel Impedance Value: 440 Ohm
Lead Channel Pacing Threshold Amplitude: 0.625 V
Lead Channel Pacing Threshold Pulse Width: 0.4 ms
Lead Channel Pacing Threshold Pulse Width: 0.4 ms
Lead Channel Sensing Intrinsic Amplitude: 12 mV
Lead Channel Setting Pacing Amplitude: 1.5 V
Lead Channel Setting Pacing Pulse Width: 0.4 ms
Lead Channel Setting Sensing Sensitivity: 2.5 mV
MDC IDC LEAD IMPLANT DT: 20110103
MDC IDC LEAD IMPLANT DT: 20110103
MDC IDC LEAD LOCATION: 753859
MDC IDC MSMT BATTERY VOLTAGE: 2.9 V
MDC IDC MSMT LEADCHNL RA PACING THRESHOLD AMPLITUDE: 0.5 V
MDC IDC MSMT LEADCHNL RA SENSING INTR AMPL: 5 mV
MDC IDC MSMT LEADCHNL RV IMPEDANCE VALUE: 560 Ohm
MDC IDC PG IMPLANT DT: 20110103
MDC IDC PG SERIAL: 7098440
MDC IDC SESS DTM: 20181025003327
MDC IDC SET LEADCHNL RV PACING AMPLITUDE: 0.875
MDC IDC STAT BRADY AP VP PERCENT: 9.3 %
MDC IDC STAT BRADY RA PERCENT PACED: 23 %
MDC IDC STAT BRADY RV PERCENT PACED: 31 %

## 2017-05-14 ENCOUNTER — Other Ambulatory Visit: Payer: Self-pay

## 2017-05-14 MED ORDER — EZETIMIBE 10 MG PO TABS: 10.0000 mg | ORAL_TABLET | Freq: Every day | ORAL | 2 refills | Status: DC

## 2017-05-21 ENCOUNTER — Ambulatory Visit (INDEPENDENT_AMBULATORY_CARE_PROVIDER_SITE_OTHER): Payer: Medicare Other | Admitting: Cardiovascular Disease

## 2017-05-21 ENCOUNTER — Encounter: Payer: Self-pay | Admitting: Cardiovascular Disease

## 2017-05-21 VITALS — BP 141/78 | HR 62 | Ht 61.0 in | Wt 145.0 lb

## 2017-05-21 DIAGNOSIS — E7800 Pure hypercholesterolemia, unspecified: Secondary | ICD-10-CM | POA: Diagnosis not present

## 2017-05-21 DIAGNOSIS — I10 Essential (primary) hypertension: Secondary | ICD-10-CM

## 2017-05-21 DIAGNOSIS — R2681 Unsteadiness on feet: Secondary | ICD-10-CM | POA: Diagnosis not present

## 2017-05-21 DIAGNOSIS — Z950 Presence of cardiac pacemaker: Secondary | ICD-10-CM | POA: Diagnosis not present

## 2017-05-21 DIAGNOSIS — R296 Repeated falls: Secondary | ICD-10-CM

## 2017-05-21 DIAGNOSIS — I442 Atrioventricular block, complete: Secondary | ICD-10-CM

## 2017-05-21 NOTE — Patient Instructions (Addendum)
Dr Royann Shiversroitoru recommends that you continue on your current medications as directed. Please refer to the Current Medication list given to you today.  Remote monitoring is used to monitor your Pacemaker or ICD from home. This monitoring reduces the number of office visits required to check your device to one time per year. It allows us to keep an eye on the functioning of your device to ensure it is working properly. You are scheduled for a device check from home on Wednesday, January 23rd, 2018. You may send your transmission at any time that day. If you have a wireless device, the transmission will be sent automatically. After your physician reviews your transmission, you will receive a notification with your next transmission date.  Dr Royann Shiversroitoru recommends that you schedule a follow-up appointment in 12 months with a pacemaker check. You will receive a reminder letter in the mail two months in advance. If you don't receive a letter, please call our office to schedule the follow-up appointment.  If you need a refill on your cardiac medications before your next appointment, please call your pharmacy.3   You have been referred to neurology for dizziness and unsteadiness.

## 2017-05-21 NOTE — Progress Notes (Signed)
Patient ID: Krista Ray, female   DOB: [birth year], 83 y.o.   MRN: [id number]    Cardiology Office Note    Date:  05/21/2017   ID:  Krista Chiquitornestine E Gropp, DOB [birth year], MRN [id number]  PCP:  Myrlene Brokerrawford, Elizabeth A, MD  Cardiologist:  Nicki Guadalajarahomas Kelly, MD; Thurmon FairMihai Kadija Cruzen, MD   Chief Complaint  Patient presents with  . Follow-up    dizzy has fallen twice recently    History of Present Illness:  Krista Chiquitornestine E Logue is a 83 y.o. female with paroxysmal complete heart block and a dual-chamber permanent pacemaker returns for follow-up.   She has not had any cardiovascular complaints.  She specifically denies chest pain, dyspnea, palpitations, syncope, leg swelling or claudication.  On the other hand she has had 2 falls and her gait is much more unsteady than in the past.  She believes that she stumbled over a scale each time.  She denies loss of consciousness.  She describes neuropathic symptoms with a "creepy crawly sensation" in both legs.  She was prescribed a medication for this by her primary care physician, but it did not provide any benefit and she stopped taking it.  She continues to live independently.  She does not have any children.  Despite hyperlipidemia she does not have known coronary artery vascular disease.  She does not take statins and is unable to really afford Zetia.  Since her last pacemaker check there has been a steady increase in the need for ventricular pacing, which now occurs 36% of the time.  Also has 23% atrial pacing.  Today there is no underlying atrioventricular conduction.  Therefore, she appears to be at least intermittently device dependent.  Current pacemaker function is excellent with estimated battery longevity of 7.6-8.0 years and excellent lead parameters.  There have been no episodes of ventricular tachycardia or atrial fibrillation.    Past Medical History:  Diagnosis Date  . Chronic back pain   . Chronic neck pain   . Coronary artery disease   . DDD  (degenerative disc disease), cervical   . DDD (degenerative disc disease), lumbar   . Hyperlipemia   . Hypertension   . MI (myocardial infarction) (HCC)   . Presence of permanent cardiac pacemaker 07/17/2009   St.Jude  . Radiculopathy   . Sciatic nerve pain     Past Surgical History:  Procedure Laterality Date  . ABDOMINAL HYSTERECTOMY  1956  . APPENDECTOMY  1944  . BACK SURGERY  2004  . CATARACT EXTRACTION Right   . NM MYOCAR PERF WALL MOTION  09/06/2009   Normal  . PACEMAKER INSERTION  07/17/2009   left side-St.Jude  . US ECHOCARDIOGRAPHY  11/28/2011   Normal    Current Medications: Outpatient Medications Prior to Visit  Medication Sig Dispense Refill  . aspirin 81 MG tablet Take 1 tablet by mouth daily.    Marland Kitchen. ezetimibe (ZETIA) 10 MG tablet Take 1 tablet (10 mg total) by mouth daily. 90 tablet 2  . latanoprost (XALATAN) 0.005 % ophthalmic solution Place 1 drop into both eyes at bedtime.   0  . lisinopril (PRINIVIL,ZESTRIL) 2.5 MG tablet Take 1 tablet (2.5 mg total) by mouth daily. 30 tablet 6  . metoprolol succinate (TOPROL-XL) 25 MG 24 hr tablet take 1 tablet by mouth once daily 90 tablet 0  . hydrochlorothiazide (MICROZIDE) 12.5 MG capsule Alternate taking 25mg  and 12.5 mg every other day. (Patient not taking: Reported on 05/21/2017) 135 capsule 3   No facility-administered medications prior to  visit.      Allergies:   Lovastatin; Penicillins; and Pravastatin sodium   Social History   Socioeconomic History  . Marital status: Widowed    Spouse name: None  . Number of children: None  . Years of education: None  . Highest education level: None  Social Needs  . Financial resource strain: None  . Food insecurity - worry: None  . Food insecurity - inability: None  . Transportation needs - medical: None  . Transportation needs - non-medical: None  Occupational History  . None  Tobacco Use  . Smoking status: Never Smoker  . Smokeless tobacco: Never Used  Substance and  Sexual Activity  . Alcohol use: No  . Drug use: No  . Sexual activity: No  Other Topics Concern  . None  Social History Narrative  . None     Family History:  The patient's family history includes Cancer in her brother; Heart disease in her father and sister; Hypertension in her father; Stroke in her father.   ROS:   Please see the history of present illness.    ROS All other systems reviewed and are negative.   PHYSICAL EXAM:   VS:  BP (!) 141/78 (Cuff Size: Normal)   Pulse 62   Ht 5\' 1"  (1.549 m)   Wt 145 lb (65.8 kg)   BMI 27.40 kg/m     General: Alert, oriented x3, no distress, relaxed Head: no evidence of trauma, PERRL, EOMI, no exophtalmos or lid lag, no myxedema, no xanthelasma; normal ears, nose and oropharynx Neck: normal jugular venous pulsations and no hepatojugular reflux; brisk carotid pulses without delay and no carotid bruits Chest: clear to auscultation, no signs of consolidation by percussion or palpation, normal fremitus, symmetrical and full respiratory excursions Cardiovascular: normal position and quality of the apical impulse, regular rhythm, normal first and second heart sounds, no murmurs, rubs or gallops Abdomen: no tenderness or distention, no masses by palpation, no abnormal pulsatility or arterial bruits, normal bowel sounds, no hepatosplenomegaly Extremities: no clubbing, cyanosis or edema; 2+ radial, ulnar and brachial pulses bilaterally; 2+ right femoral, posterior tibial and dorsalis pedis pulses; 2+ left femoral, posterior tibial and dorsalis pedis pulses; no subclavian or femoral bruits Neurological: grossly nonfocal.  Walks with a slow and unsteady gait using a cane. Psych: Normal mood and affect   Wt Readings from Last 3 Encounters:  05/21/17 145 lb (65.8 kg)  02/21/17 145 lb 9.6 oz (66 kg)  12/11/16 147 lb 3.2 oz (66.8 kg)      Studies/Labs Reviewed:   EKG:  EKG is ordered today.  The ekg demonstrates normal sinus rhythm and right  bundle branch block and left posterior fascicular block  Recent Labs: 12/23/2016: ALT 18; Hemoglobin 14.7; Platelets 241; TSH 1.430 03/06/2017: BUN 23; Creatinine, Ser 0.98; Potassium 4.3; Sodium 142   Lipid Panel    Component Value Date/Time   CHOL 234 (H) 12/23/2016 0815   TRIG 93 12/23/2016 0815   HDL 73 12/23/2016 0815   CHOLHDL 3.2 12/23/2016 0815   CHOLHDL 3.1 08/15/2016 0956   VLDL 17 08/15/2016 0956   LDLCALC 142 (H) 12/23/2016 0815   LDLDIRECT 137.0 02/22/2013 1006     ASSESSMENT:    1. Third degree atrioventricular block (HCC)   2. Pacemaker   3. Essential hypertension   4. Hypercholesteremia   5. Unsteady gait   6. Frequent falls      PLAN:  In order of problems listed above:  1. 3rd deg  AV block: Prolonged complete heart block was documented prior to device implantation and she even required temporary transvenous pacing, but currently requires intermittent ventricular pacing 2. PPM: Normal pacemaker function. Continue remote downloads every 3 months and office visit yearly 3. HTN: Control is imperfect, but acceptable.  We were not able to document any significant orthostatic hypotension today.  We will continue the same medication. 4. Hyperlipidemia: intolerant to statins and unable to afford Zetia.  There is no compelling reason to treat this 83 year old woman in the absence of known vascular disease. 5. Unsteady gait/falls: Recommended that she seek evaluation by neurology and physical therapy.  She accepted the referral.  In the meantime I advised her to use a walker rather than the cane and to clear the floors of her home of obstacles.    Medication Adjustments/Labs and Tests Ordered: Current medicines are reviewed at length with the patient today.  Concerns regarding medicines are outlined above.  Medication changes, Labs and Tests ordered today are listed in the Patient Instructions below. Patient Instructions  Dr Royann Shiversroitoru recommends that you continue on  your current medications as directed. Please refer to the Current Medication list given to you today.  Remote monitoring is used to monitor your Pacemaker or ICD from home. This monitoring reduces the number of office visits required to check your device to one time per year. It allows us to keep an eye on the functioning of your device to ensure it is working properly. You are scheduled for a device check from home on Wednesday, January 23rd, 2018. You may send your transmission at any time that day. If you have a wireless device, the transmission will be sent automatically. After your physician reviews your transmission, you will receive a notification with your next transmission date.  Dr Royann Shiversroitoru recommends that you schedule a follow-up appointment in 12 months with a pacemaker check. You will receive a reminder letter in the mail two months in advance. If you don't receive a letter, please call our office to schedule the follow-up appointment.  If you need a refill on your cardiac medications before your next appointment, please call your pharmacy.3   You have been referred to neurology for dizziness and unsteadiness.    Signed, Thurmon FairMihai Roselynn Whitacre, MD  05/21/2017 9:04 AM    [facility] [address], [place], [place]  [address] Phone: [phone]; Fax: [phone]

## 2017-05-26 LAB — CUP PACEART INCLINIC DEVICE CHECK
Battery Remaining Longevity: 90 mo
Battery Remaining Percentage: 65 %
Date Time Interrogation Session: 20181112132659
Implantable Lead Location: 753859
Implantable Pulse Generator Implant Date: 20110103
Lead Channel Pacing Threshold Amplitude: 0.75 V
Lead Channel Pacing Threshold Pulse Width: 0.4 ms
Lead Channel Setting Pacing Amplitude: 0.875
Lead Channel Setting Pacing Amplitude: 1.5 V
Lead Channel Setting Pacing Pulse Width: 0.4 ms
Lead Channel Setting Sensing Sensitivity: 2.5 mV
MDC IDC LEAD IMPLANT DT: 20110103
MDC IDC LEAD IMPLANT DT: 20110103
MDC IDC LEAD LOCATION: 753860
MDC IDC MSMT BATTERY VOLTAGE: 2.9 V
MDC IDC MSMT LEADCHNL RA IMPEDANCE VALUE: 410 Ohm
MDC IDC MSMT LEADCHNL RA PACING THRESHOLD AMPLITUDE: 0.5 V
MDC IDC MSMT LEADCHNL RA PACING THRESHOLD PULSEWIDTH: 0.4 ms
MDC IDC MSMT LEADCHNL RA SENSING INTR AMPL: 1.8 mV
MDC IDC MSMT LEADCHNL RV IMPEDANCE VALUE: 560 Ohm
MDC IDC PG SERIAL: 7098440
MDC IDC STAT BRADY RA PERCENT PACED: 23 %
MDC IDC STAT BRADY RV PERCENT PACED: 36 %
Pulse Gen Model: 2110

## 2017-05-29 ENCOUNTER — Ambulatory Visit (INDEPENDENT_AMBULATORY_CARE_PROVIDER_SITE_OTHER): Payer: Medicare Other | Admitting: Cardiovascular Disease

## 2017-05-29 ENCOUNTER — Encounter: Payer: Self-pay | Admitting: Cardiovascular Disease

## 2017-05-29 VITALS — BP 146/68 | HR 60 | Ht 61.0 in | Wt 143.0 lb

## 2017-05-29 DIAGNOSIS — I10 Essential (primary) hypertension: Secondary | ICD-10-CM | POA: Diagnosis not present

## 2017-05-29 DIAGNOSIS — I519 Heart disease, unspecified: Secondary | ICD-10-CM

## 2017-05-29 DIAGNOSIS — I442 Atrioventricular block, complete: Secondary | ICD-10-CM | POA: Diagnosis not present

## 2017-05-29 DIAGNOSIS — Z950 Presence of cardiac pacemaker: Secondary | ICD-10-CM

## 2017-05-29 DIAGNOSIS — E7800 Pure hypercholesterolemia, unspecified: Secondary | ICD-10-CM | POA: Diagnosis not present

## 2017-05-29 DIAGNOSIS — I5189 Other ill-defined heart diseases: Secondary | ICD-10-CM

## 2017-05-29 NOTE — Progress Notes (Signed)
Patient ID: Krista Ray, female   DOB: [birth year], 83 y.o.   MRN: [id number]     HPI: Krista Ray is a 83 y.o. female who presents to the office today for a 3 month followup cardiology evaluation.  [name] developed third degree heart block and underwent permanent pacemaker insertion on 07/17/2009 with a [employer]. Jude's dual-chamber pacemaker. Additional problems include hypertension with grade 1 diastolic dysfunction, hyperlipidemia, aortic valve sclerosis, mild to moderate tricuspid and mitral regurgitation documented on echo Doppler study in 2011 as well as osteoarthritis and degenerative lumbar spine disease.  Remotely, a nuclear perfusion study in 2011 showed normal perfusion. Dr. Lovena Le had placed her pacemaker in 2011 and Dr. Recardo Evangelist has been following her pacemaker.  She tells her pacemaker was last interrogated approximately 3 months ago, and she had intact pacemaker function.  The pacemaker clinic evaluation from July 2015 showed estimated longevity is 10.6 years.   Apparently, I have not seen her since her October 2015 evaluation.  She was seen Dr. Sallyanne Kuster for her pacemaker interrogation on a yearly basis with his last evaluation in October 2017.  She saw Krista Ray in January 2018 at which time she was hypertensive.  An echo Doppler study in August 2017 showed an EF of 60-65%.  There was grade 1 diastolic dysfunction.  Tissue Doppler was consistent with high ventricular filling pressure.  Estimated PA pressure was 25 mm.    When I last saw her 3 months ago, she denied any chest pain, PND or shortness of breath.  She was having some mild ankle swelling and HCTZ was recommended.  Laboratory in August showed a creatinine of 0.98.  TSH had been normal.  Her pacemaker was interrogated by Dr. Sallyanne Kuster 05/21/2017.  There has been a steady increase in the need for ventricular pacing which was now occurring 36% of the time and she was having 23% atrial pacing.  Her current  pacemaker function was excellent.  An estimated battery longevity was 7.6-8 years.  There were no episodes of VT or atrial fibrillation.  She presents for follow-up cardiology evaluation.  Past Medical History:  Diagnosis Date  . Chronic back pain   . Chronic neck pain   . Coronary artery disease   . DDD (degenerative disc disease), cervical   . DDD (degenerative disc disease), lumbar   . Hyperlipemia   . Hypertension   . MI (myocardial infarction) ([place])   . Presence of permanent cardiac pacemaker 07/17/2009   St.Jude  . Radiculopathy   . Sciatic nerve pain     Past Surgical History:  Procedure Laterality Date  . ABDOMINAL HYSTERECTOMY  1956  . APPENDECTOMY  1944  . BACK SURGERY  2004  . CATARACT EXTRACTION Right   . NM MYOCAR PERF WALL MOTION  09/06/2009   Normal  . PACEMAKER INSERTION  07/17/2009   left side-St.Jude  . US ECHOCARDIOGRAPHY  11/28/2011   Normal    Allergies  Allergen Reactions  . Lovastatin Nausea And Vomiting  . Penicillins Other (See Comments)    Unknown been years ago  . Pravastatin Sodium Nausea And Vomiting    Current Outpatient Medications  Medication Sig Dispense Refill  . aspirin 81 MG tablet Take 1 tablet by mouth daily.    Marland Kitchen ezetimibe (ZETIA) 10 MG tablet Take 1 tablet (10 mg total) by mouth daily. 90 tablet 2  . latanoprost (XALATAN) 0.005 % ophthalmic solution Place 1 drop into both eyes at bedtime.   0  .  metoprolol succinate (TOPROL-XL) 25 MG 24 hr tablet take 1 tablet by mouth once daily 90 tablet 0  . lisinopril (PRINIVIL,ZESTRIL) 2.5 MG tablet Take 1 tablet (2.5 mg total) by mouth daily. 30 tablet 6   No current facility-administered medications for this visit.     Social History   Socioeconomic History  . Marital status: Widowed    Spouse name: Not on file  . Number of children: Not on file  . Years of education: Not on file  . Highest education level: Not on file  Social Needs  . Financial resource strain: Not on file  . Food  insecurity - worry: Not on file  . Food insecurity - inability: Not on file  . Transportation needs - medical: Not on file  . Transportation needs - non-medical: Not on file  Occupational History  . Not on file  Tobacco Use  . Smoking status: Never Smoker  . Smokeless tobacco: Never Used  Substance and Sexual Activity  . Alcohol use: No  . Drug use: No  . Sexual activity: No  Other Topics Concern  . Not on file  Social History Narrative  . Not on file    Family History  Problem Relation Age of Onset  . Heart disease Father   . Hypertension Father   . Stroke Father   . Heart disease Sister   . Cancer Brother   . Diabetes Neg Hx    ROS General: Negative; No fevers, chills, or night sweats;  HEENT: Negative; No changes in vision or hearing, sinus congestion, difficulty swallowing Pulmonary: Negative; No cough, wheezing, shortness of breath, hemoptysis Cardiovascular: Negative; No chest pain, presyncope, syncope, palpitations GI: Negative; No nausea, vomiting, diarrhea, or abdominal pain GU: Negative; No dysuria, hematuria, or difficulty voiding Musculoskeletal: Negative; no myalgias, joint pain, or weakness Hematologic/Oncology: Negative; no easy bruising, bleeding Endocrine: Positive for diabetes, diagnosed this past  year; no heat/cold intolerance;  Neuro: Positive for issues with sciatica; no changes in balance, headaches Skin: Negative; No rashes or skin lesions Psychiatric: Negative; No behavioral problems, depression Sleep: Negative; No snoring, daytime sleepiness, hypersomnolence, bruxism, restless legs, hypnogognic hallucinations, no cataplexy Other comprehensive 14 point system review is negative.   Physical Exam BP (!) 146/68   Pulse 60   Ht 5' 1" (1.549 m)   Wt 143 lb (64.9 kg)   BMI 27.02 kg/m    Repeat blood pressure by me was 140/76  Wt Readings from Last 3 Encounters:  05/29/17 143 lb (64.9 kg)  05/21/17 145 lb (65.8 kg)  02/21/17 145 lb 9.6 oz  (66 kg)   General: Alert, oriented, no distress.  Skin: normal turgor, no rashes, warm and dry HEENT: Normocephalic, atraumatic. Pupils equal round and reactive to light; sclera anicteric; extraocular muscles intact; bilateral arcus senilis. Nose without nasal septal hypertrophy Mouth/Parynx benign; Mallinpatti scale 3 Neck: No JVD, no carotid bruits; normal carotid upstroke Lungs: clear to ausculatation and percussion; no wheezing or rales Chest wall: without tenderness to palpitation Heart: PMI not displaced, RRR, s1 s2 normal, 1/6 systolic murmur, no diastolic murmur, no rubs, gallops, thrills, or heaves Abdomen: soft, nontender; no hepatosplenomehaly, BS+; abdominal aorta nontender and not dilated by palpation. Back: no CVA tenderness Pulses 2+ Musculoskeletal: full range of motion, normal strength, no joint deformities Extremities: no clubbing cyanosis or edema, Homan's sign negative  Neurologic: grossly nonfocal; Cranial nerves grossly wnl Psychologic: Normal mood and affect  ECG (independently read by me): AV paced rhythm with 100% capture.  August  2018 ECG (independently read by me): Sinus rhythm at 64 bpm with PAC.  Nonspecific conduction delay.  QTc interval 42 ms.  May 2018 ECG (independently read by me): Normal sinus rhythm at 64 bpm.  There is one paced atrial beat, otherwise all beats are appropriately sensed. PR interval 140 ms.  QTc interval 468 ms.  Right bundle-branch block.  ECG (independently read by me): 100% atrially paced with ventricular sensing.  Heart rate 61 beats per minute.  Right bundle branch block.  Prior ECG: Normal sinus rhythm with incomplete right bundle branch block. PR interval 128 ms. Most of the time she appears to be atrial and ventricular sensing but she did have several atrial paced beats.  LABS:  BMP Latest Ref Rng & Units 03/06/2017 12/23/2016 08/15/2016  Glucose 65 - 99 mg/dL 87 94 97  BUN 10 - 36 mg/dL _0 Creatinine 0.57 - 1.00 mg/dL  0.98 0.87 1.05(H)  BUN/Creat Ratio 12 - _1 -  Sodium 134 - 144 mmol/L 142 140 144  Potassium 3.5 - 5.2 mmol/L 4.3 4.6 4.6  Chloride 96 - 106 mmol/L 101 99 104  CO2 20 - 29 mmol/L _2 Calcium 8.7 - 10.3 mg/dL 9.6 9.7 9.2  \ Hepatic Function Latest Ref Rng & Units 12/23/2016 05/11/2015 01/10/2015  Total Protein 6.0 - 8.5 g/dL 7.2 6.6 7.3  Albumin 3.2 - 4.6 g/dL 4.0 3.7 3.9  AST 0 - 40 IU/L _3 ALT 0 - 32 IU/L _4 Alk Phosphatase 39 - 117 IU/L 70 77 74  Total Bilirubin 0.0 - 1.2 mg/dL 0.6 0.9 0.7  Bilirubin, Direct 0.0 - 0.3 mg/dL - - -   CBC Latest Ref Rng & Units 12/23/2016 10/23/2015 05/02/2014  WBC 3.4 - 10.8 x10E3/uL 4.6 5.6 4.4  Hemoglobin 11.1 - 15.9 g/dL 14.7 14.0 14.8  Hematocrit 34.0 - 46.6 % 44.5 44.4 44.1  Platelets 150 - 379 x10E3/uL 241 224 238   Lab Results  Component Value Date   MCV 92 12/23/2016   MCV 97.4 10/23/2015   MCV 92.3 05/02/2014   Lab Results  Component Value Date   TSH 1.430 12/23/2016   Lab Results  Component Value Date   HGBA1C 6.0 (H) 12/23/2016   Lipid Panel     Component Value Date/Time   CHOL 234 (H) 12/23/2016 0815   TRIG 93 12/23/2016 0815   HDL 73 12/23/2016 0815   CHOLHDL 3.2 12/23/2016 0815   CHOLHDL 3.1 08/15/2016 0956   VLDL 17 08/15/2016 0956   LDLCALC 142 (H) 12/23/2016 0815   LDLDIRECT 137.0 02/22/2013 1006      RADIOLOGY: No results found.  IMPRESSION: 1. Essential hypertension   2. Third degree atrioventricular block ([place])   3. Pacemaker   4. Pure hypercholesterolemia   5. Grade I diastolic dysfunction     ASSESSMENT AND PLAN: Ms. Celaya is a 83 year old African-American female who developed complete heart block on 07/15/2009 when she presented with a ventricular rate in the 30s. At that time I placed a temporary pacemaker and on July 17, 2009 she underwent permanent pacemaker insertion with a [employer]. Jude's dual-chamber pacemaker. She has been undergoing yearly pacemaker interrogation.  Prior  to her evaluations this year.  I had not seen her in over 2-1/2.  His.  She was found to be hypertensive and had ankle edema and I initiated lisinopril and started low-dose HCTZ.  Since I last saw her, apparently, she is  no longer taking HCTZ and continues to be on low-dose lisinopril.  Her blood pressure today is mildly increased at 140/76, on recheck by me.  With her age and renal insufficiency II will not further titrate lisinopril presently.  Her last echo Doppler study in August 2017 showed an EF of 60-65% with grade 1 diastolic dysfunction.  She had normal pulmonary pressures.  Over the years, she has had steady increase in the need for ventricular pacing.  ECG today shows AV paced rhythm with 100% capture.  She continues to walk and still drivesi.  She is on Zetia 10 mg for hyperlipidemia and has been intolerant to statins.  Troy Sine, MD, [facility]  05/31/2017 7:14 PM

## 2017-05-29 NOTE — Patient Instructions (Signed)

## 2017-05-31 ENCOUNTER — Encounter: Payer: Self-pay | Admitting: Cardiovascular Disease

## 2017-06-10 ENCOUNTER — Other Ambulatory Visit: Payer: Self-pay | Admitting: Cardiovascular Disease

## 2017-06-20 ENCOUNTER — Other Ambulatory Visit: Payer: Self-pay | Admitting: Internal Medicine

## 2017-07-24 ENCOUNTER — Telehealth: Payer: Self-pay | Admitting: Cardiovascular Disease

## 2017-07-24 NOTE — Telephone Encounter (Signed)
Krista Ray is calling because she had a fall and now having problems with her back . States that she has been unable to sleep . She fell about a month ago .  Please call

## 2017-07-24 NOTE — Telephone Encounter (Signed)
Returned the call to the patient. She stated that she has been having back pain that has her concerned that it may be cardiac related. She did have a fall about a month ago and states it may be related to that. [facility]. She states that she would feel more comfortable speaking with him than a primary physician.

## 2017-07-25 ENCOUNTER — Encounter: Payer: Self-pay | Admitting: Cardiovascular Disease

## 2017-07-25 ENCOUNTER — Ambulatory Visit: Payer: Medicare Other | Admitting: Cardiovascular Disease

## 2017-07-25 VITALS — BP 142/64 | HR 70 | Ht 61.0 in | Wt 142.0 lb

## 2017-07-25 DIAGNOSIS — Z950 Presence of cardiac pacemaker: Secondary | ICD-10-CM | POA: Diagnosis not present

## 2017-07-25 DIAGNOSIS — I5189 Other ill-defined heart diseases: Secondary | ICD-10-CM

## 2017-07-25 DIAGNOSIS — I519 Heart disease, unspecified: Secondary | ICD-10-CM

## 2017-07-25 DIAGNOSIS — I10 Essential (primary) hypertension: Secondary | ICD-10-CM | POA: Diagnosis not present

## 2017-07-25 DIAGNOSIS — E7800 Pure hypercholesterolemia, unspecified: Secondary | ICD-10-CM | POA: Diagnosis not present

## 2017-07-25 NOTE — Progress Notes (Signed)
Patient ID: Krista Ray, female   DOB: [birth year], 84 y.o.   MRN: [id number]     HPI: Krista Ray is a 84 y.o. female who presents to the office today for a 3 month followup cardiology evaluation.  [name] developed third degree heart block and underwent permanent pacemaker insertion on 07/17/2009 with a St. Jude's dual-chamber pacemaker. Additional problems include hypertension with grade 1 diastolic dysfunction, hyperlipidemia, aortic valve sclerosis, mild to moderate tricuspid and mitral regurgitation documented on echo Doppler study in 2011 as well as osteoarthritis and degenerative lumbar spine disease.  Remotely, a nuclear perfusion study in 2011 showed normal perfusion. Dr. Lovena Le had placed her pacemaker in 2011 and Dr. Recardo Evangelist has been following her pacemaker.  She tells her pacemaker was last interrogated approximately 3 months ago, and she had intact pacemaker function.  The pacemaker clinic evaluation from July 2015 showed estimated longevity is 10.6 years.   Apparently, I have not seen her since her October 2015 evaluation.  She was seen Dr. Sallyanne Kuster for her pacemaker interrogation on a yearly basis with his last evaluation in October 2017.  She saw Cecilie Kicks in January 2018 at which time she was hypertensive.  An echo Doppler study in August 2017 showed an EF of 60-65%.  There was grade 1 diastolic dysfunction.  Tissue Doppler was consistent with high ventricular filling pressure.  Estimated PA pressure was 25 mm.    Her pacemaker was interrogated by Dr. Sallyanne Kuster 05/21/2017.  There has been a steady increase in the need for ventricular pacing which was now occurring 36% of the time and she was having 23% atrial pacing.  Her current pacemaker function was excellent.  An estimated battery longevity was 7.6-8 years.There were no episodes of VT or atrial fibrillation.    Since I last saw her, she has felt well.  She had fallen after she tripped on a scale that was on  the floor.  At times she has some urinary incontinence issues.  She had noted some issues with back pain and was concerned perhaps that this was cardiac related.  However, she had taken an over-the-counter medication for inflammation which resolved her symptoms and seem to have helped her arthritis.  She denies chest pain.  She is unaware of tachycardia palpitations.  She has continued to be on lisinopril at 2.5 mg daily and Toprol-XL 25 mg for hypertension.  She is on Zetia 10 mg for hyperlipidemia.  She presents for reevaluation.  Past Medical History:  Diagnosis Date  . Chronic back pain   . Chronic neck pain   . Coronary artery disease   . DDD (degenerative disc disease), cervical   . DDD (degenerative disc disease), lumbar   . Hyperlipemia   . Hypertension   . MI (myocardial infarction) ([place])   . Presence of permanent cardiac pacemaker 07/17/2009   St.Jude  . Radiculopathy   . Sciatic nerve pain     Past Surgical History:  Procedure Laterality Date  . ABDOMINAL HYSTERECTOMY  1956  . APPENDECTOMY  1944  . BACK SURGERY  2004  . CATARACT EXTRACTION Right   . NM MYOCAR PERF WALL MOTION  09/06/2009   Normal  . PACEMAKER INSERTION  07/17/2009   left side-St.Jude  . US ECHOCARDIOGRAPHY  11/28/2011   Normal    Allergies  Allergen Reactions  . Lovastatin Nausea And Vomiting  . Penicillins Other (See Comments)    Unknown been years ago  . Pravastatin Sodium Nausea And Vomiting  Current Outpatient Medications  Medication Sig Dispense Refill  . aspirin 81 MG tablet Take 1 tablet by mouth daily.    Marland Kitchen ezetimibe (ZETIA) 10 MG tablet Take 1 tablet (10 mg total) by mouth daily. 90 tablet 2  . latanoprost (XALATAN) 0.005 % ophthalmic solution Place 1 drop into both eyes as needed.   0  . lisinopril (PRINIVIL,ZESTRIL) 2.5 MG tablet TAKE 1 TABLET BY MOUTH EVERY DAY 30 tablet 6  . metoprolol succinate (TOPROL-XL) 25 MG 24 hr tablet take 1 tablet by mouth once daily 90 tablet 0   No  current facility-administered medications for this visit.     Social History   Socioeconomic History  . Marital status: Widowed    Spouse name: Not on file  . Number of children: Not on file  . Years of education: Not on file  . Highest education level: Not on file  Social Needs  . Financial resource strain: Not on file  . Food insecurity - worry: Not on file  . Food insecurity - inability: Not on file  . Transportation needs - medical: Not on file  . Transportation needs - non-medical: Not on file  Occupational History  . Not on file  Tobacco Use  . Smoking status: Never Smoker  . Smokeless tobacco: Never Used  Substance and Sexual Activity  . Alcohol use: No  . Drug use: No  . Sexual activity: No  Other Topics Concern  . Not on file  Social History Narrative  . Not on file    Family History  Problem Relation Age of Onset  . Heart disease Father   . Hypertension Father   . Stroke Father   . Heart disease Sister   . Cancer Brother   . Diabetes Neg Hx    ROS General: Negative; No fevers, chills, or night sweats;  HEENT: Negative; No changes in vision or hearing, sinus congestion, difficulty swallowing Pulmonary: Negative; No cough, wheezing, shortness of breath, hemoptysis Cardiovascular: Negative; No chest pain, presyncope, syncope, palpitations GI: Negative; No nausea, vomiting, diarrhea, or abdominal pain GU: Negative; No dysuria, hematuria, or difficulty voiding Musculoskeletal: Negative; no myalgias, joint pain, or weakness Hematologic/Oncology: Negative; no easy bruising, bleeding Endocrine: Positive for diabetes, diagnosed this past  year; no heat/cold intolerance;  Neuro: Positive for issues with sciatica; no changes in balance, headaches Skin: Negative; No rashes or skin lesions Psychiatric: Negative; No behavioral problems, depression Sleep: Negative; No snoring, daytime sleepiness, hypersomnolence, bruxism, restless legs, hypnogognic hallucinations, no  cataplexy Other comprehensive 14 point system review is negative.   Physical Exam BP (!) 142/64   Pulse 70   Ht '5\' 1"'  (1.549 m)   Wt 142 lb (64.4 kg)   BMI 26.83 kg/m    Repeat blood pressure by me was 140/70  Wt Readings from Last 3 Encounters:  07/25/17 142 lb (64.4 kg)  05/29/17 143 lb (64.9 kg)  05/21/17 145 lb (65.8 kg)     Physical Exam BP (!) 142/64   Pulse 70   Ht '5\' 1"'  (1.549 m)   Wt 142 lb (64.4 kg)   BMI 26.83 kg/m  General: Alert, oriented, no distress.  Skin: normal turgor, no rashes, warm and dry HEENT: Normocephalic, atraumatic. Pupils equal round and reactive to light; sclera anicteric; extraocular muscles intact;  Nose without nasal septal hypertrophy Mouth/Parynx benign; Mallinpatti scale 3 Neck: No JVD, no carotid bruits; normal carotid upstroke Lungs: clear to ausculatation and percussion; no wheezing or rales Chest wall: without tenderness to  palpitation Heart: PMI not displaced, RRR, s1 s2 normal, 1/6 systolic murmur, no diastolic murmur, no rubs, gallops, thrills, or heaves Abdomen: soft, nontender; no hepatosplenomehaly, BS+; abdominal aorta nontender and not dilated by palpation. Back: no CVA tenderness Pulses 2+ Musculoskeletal: full range of motion, normal strength, no joint deformities Extremities: no clubbing cyanosis or edema, Homan's sign negative  Neurologic: grossly nonfocal; Cranial nerves grossly wnl Psychologic: Normal mood and affect   ECG (independently read by me): Atrial sensed, 100% ventricular paced rhythm at 70 bpm.  November 2018 ECG (independently read by me): AV paced rhythm with 100% capture.  August 2018 ECG (independently read by me): Sinus rhythm at 64 bpm with PAC.  Nonspecific conduction delay.  QTc interval 42 ms.  May 2018 ECG (independently read by me): Normal sinus rhythm at 64 bpm.  There is one paced atrial beat, otherwise all beats are appropriately sensed. PR interval 140 ms.  QTc interval 468 ms.  Right  bundle-branch block.  ECG (independently read by me): 100% atrially paced with ventricular sensing.  Heart rate 61 beats per minute.  Right bundle branch block.  Prior ECG: Normal sinus rhythm with incomplete right bundle branch block. PR interval 128 ms. Most of the time she appears to be atrial and ventricular sensing but she did have several atrial paced beats.  LABS:  BMP Latest Ref Rng & Units 03/06/2017 12/23/2016 08/15/2016  Glucose 65 - 99 mg/dL 87 94 97  BUN 10 - 36 mg/dL '23 19 17  ' Creatinine 0.57 - 1.00 mg/dL 0.98 0.87 1.05(H)  BUN/Creat Ratio 12 - '28 23 22 ' -  Sodium 134 - 144 mmol/L 142 140 144  Potassium 3.5 - 5.2 mmol/L 4.3 4.6 4.6  Chloride 96 - 106 mmol/L 101 99 104  CO2 20 - 29 mmol/L '25 26 30  ' Calcium 8.7 - 10.3 mg/dL 9.6 9.7 9.2  \ Hepatic Function Latest Ref Rng & Units 12/23/2016 05/11/2015 01/10/2015  Total Protein 6.0 - 8.5 g/dL 7.2 6.6 7.3  Albumin 3.2 - 4.6 g/dL 4.0 3.7 3.9  AST 0 - 40 IU/L '23 22 24  ' ALT 0 - 32 IU/L '18 21 24  ' Alk Phosphatase 39 - 117 IU/L 70 77 74  Total Bilirubin 0.0 - 1.2 mg/dL 0.6 0.9 0.7  Bilirubin, Direct 0.0 - 0.3 mg/dL - - -   CBC Latest Ref Rng & Units 12/23/2016 10/23/2015 05/02/2014  WBC 3.4 - 10.8 x10E3/uL 4.6 5.6 4.4  Hemoglobin 11.1 - 15.9 g/dL 14.7 14.0 14.8  Hematocrit 34.0 - 46.6 % 44.5 44.4 44.1  Platelets 150 - 379 x10E3/uL 241 224 238   Lab Results  Component Value Date   MCV 92 12/23/2016   MCV 97.4 10/23/2015   MCV 92.3 05/02/2014   Lab Results  Component Value Date   TSH 1.430 12/23/2016   Lab Results  Component Value Date   HGBA1C 6.0 (H) 12/23/2016   Lipid Panel     Component Value Date/Time   CHOL 234 (H) 12/23/2016 0815   TRIG 93 12/23/2016 0815   HDL 73 12/23/2016 0815   CHOLHDL 3.2 12/23/2016 0815   CHOLHDL 3.1 08/15/2016 0956   VLDL 17 08/15/2016 0956   LDLCALC 142 (H) 12/23/2016 0815   LDLDIRECT 137.0 02/22/2013 1006      RADIOLOGY: No results found.  IMPRESSION: No diagnosis  found.  ASSESSMENT AND PLAN: Krista Ray is a 84 year old African-American female who developed complete heart block on 07/15/2009 when she presented with a ventricular rate in the  30s. At that time I placed a temporary pacemaker and on July 17, 2009 she underwent permanent pacemaker insertion with a St. Jude's dual-chamber pacemaker. She has been undergoing yearly pacemaker interrogation.  She has a history of hypertension and is currently on lisinopril at only 2.5 mg daily.  When she had been previously seen.  It was suggested she take HCTZ which she had stopped taking.  She has had some urinary issues with some incontinence.  She has stage III renal insufficiency.  Presently, her blood pressure is upper normal.  With her age of soon to be 76 I have recommended she continue current therapy and will not increase the lisinopril dosing.. Her pacemaker is functioning appropriately and there has been a steady increase in the need for ventricular pacing.  Her estimated battery longevity is 7-8 years.  I have suggested a urologic evaluation in light of her incontinence or bladder issues.  She is on Zetia for hyperlipidemia and has an intolerance to statins.  She will see Dr. Sallyanne Kuster for her pacemaker follow-up evaluation later this year.  I will see her in one year for reevaluation. Troy Sine, MD, [facility]  07/25/2017 2:46 PM

## 2017-07-25 NOTE — Patient Instructions (Signed)

## 2017-07-26 ENCOUNTER — Encounter: Payer: Self-pay | Admitting: Cardiovascular Disease

## 2017-07-30 ENCOUNTER — Encounter: Payer: Self-pay | Admitting: Neurology

## 2017-07-30 ENCOUNTER — Ambulatory Visit: Payer: Medicare Other | Admitting: Neurology

## 2017-07-30 VITALS — BP 154/70 | HR 65 | Ht 61.0 in | Wt 143.0 lb

## 2017-07-30 DIAGNOSIS — S4991XS Unspecified injury of right shoulder and upper arm, sequela: Secondary | ICD-10-CM | POA: Diagnosis not present

## 2017-07-30 DIAGNOSIS — W19XXXS Unspecified fall, sequela: Secondary | ICD-10-CM

## 2017-07-30 DIAGNOSIS — M25511 Pain in right shoulder: Secondary | ICD-10-CM | POA: Diagnosis not present

## 2017-07-30 NOTE — Progress Notes (Signed)
Thank you for the update MCr 

## 2017-07-30 NOTE — Progress Notes (Signed)
Subjective:    Patient ID: Krista Ray is a 84 y.o. female.  HPI     Huston Foley, MD, PhD [facility] [address] [address] [place], [place] [address]  Dear Dr. Royann Shivers,   I saw your patient, Krista Ray, upon your kind request in my neurologic clinic today for initial consultation of her gait disorder and recent falls. The patient is accompanied by her niece Krista Ray, today. As you know, [name] is a 84 year old right-handed woman with an underlying medical history of hyperlipidemia, complete heart block with status post pacemaker placement, chronic back pain and neck pain, hypertension, hyperlipidemia, and mildly overweight state, who reports unsteady gait and recent falls, maybe a month ago. I reviewed your office note from 05/21/2017. The patient was referred to physical therapy. She was reminded to use her walker. Of note, patient is widowed and lives alone, she has no children. She had a carotid Doppler ultrasound on 01/26/2016 and I reviewed the results: IMPRESSION: Minor carotid atherosclerosis. No hemodynamically significant ICA stenosis. Degree of narrowing less than 50% bilaterally. Patent antegrade vertebral flow bilaterally.   She had a head CT without contrast in November 2011 with indication of weakness and unsteady gait, I reviewed the results: IMPRESSION:   1.  Mild atrophy and stable moderate small vessel ischemic change. 2.  No acute intracranial abnormality.  When she fell about a month ago she fell onto her right arm and shoulder. For the past few weeks she has not been able to move her right shoulder properly. She has pain. She cannot reach the cabinets. She has been using a cane. She lives alone but has a call alert button. She has several nieces and nephews involved in her life. She practically does not drive any longer. Krista Ray is the daughter of her brother. Patient is the youngest of a total of 10 siblings and the only  surviving sibling at this time. Marcia's brother and sister are also involved in her care, another niece, Tyler Aas, her sister's daughter is also involved in her care. Patient went to primary care physician yesterday. She mentioned her right shoulder pain. Apparently an x-ray was ordered but patient does not know where to go for this or if the order is done. She is advised to find out on their way back home. She tries to hydrate well. She denies any strokelike symptoms such as one-sided numbness or tingling or weakness. She denies hitting her head or loss of consciousness.    Her Past Medical History Is Significant For: Past Medical History:  Diagnosis Date  . Chronic back pain   . Chronic neck pain   . Coronary artery disease   . DDD (degenerative disc disease), cervical   . DDD (degenerative disc disease), lumbar   . Hyperlipemia   . Hypertension   . MI (myocardial infarction) ([facility])   . Presence of permanent cardiac pacemaker 07/17/2009   St.Jude  . Radiculopathy   . Sciatic nerve pain     Her Past Surgical History Is Significant For: Past Surgical History:  Procedure Laterality Date  . ABDOMINAL HYSTERECTOMY  1956  . APPENDECTOMY  1944  . BACK SURGERY  2004  . CATARACT EXTRACTION Right   . NM MYOCAR PERF WALL MOTION  09/06/2009   Normal  . PACEMAKER INSERTION  07/17/2009   left side-St.Jude  . US ECHOCARDIOGRAPHY  11/28/2011   Normal    Her Family History Is Significant For: Family History  Problem Relation Age  of Onset  . Heart disease Father   . Hypertension Father   . Stroke Father   . Heart disease Sister   . Cancer Brother   . Diabetes Neg Hx     Her Social History Is Significant For: Social History   Socioeconomic History  . Marital status: Widowed    Spouse name: None  . Number of children: None  . Years of education: None  . Highest education level: None  Social Needs  . Financial resource strain: None  . Food insecurity - worry: None  . Food insecurity -  inability: None  . Transportation needs - medical: None  . Transportation needs - non-medical: None  Occupational History  . None  Tobacco Use  . Smoking status: Never Smoker  . Smokeless tobacco: Never Used  Substance and Sexual Activity  . Alcohol use: No  . Drug use: No  . Sexual activity: No  Other Topics Concern  . None  Social History Narrative  . None    Her Allergies Are:  Allergies  Allergen Reactions  . Lovastatin Nausea And Vomiting  . Penicillins Other (See Comments)    Unknown been years ago  . Pravastatin Sodium Nausea And Vomiting  :   Her Current Medications Are:  Outpatient Encounter Medications as of 07/30/2017  Medication Sig  . aspirin 81 MG tablet Take 1 tablet by mouth daily.  Marland Kitchen. ezetimibe (ZETIA) 10 MG tablet Take 1 tablet (10 mg total) by mouth daily.  Marland Kitchen. latanoprost (XALATAN) 0.005 % ophthalmic solution Place 1 drop into both eyes as needed.   Marland Kitchen. lisinopril (PRINIVIL,ZESTRIL) 2.5 MG tablet TAKE 1 TABLET BY MOUTH EVERY DAY  . metoprolol succinate (TOPROL-XL) 25 MG 24 hr tablet take 1 tablet by mouth once daily   No facility-administered encounter medications on file as of 07/30/2017.   : Review of Systems:  Out of a complete 14 point review of systems, all are reviewed and negative with the exception of these symptoms as listed below:  Review of Systems  Neurological:       Pt presents today to discuss her falls. Pt fell about one month ago over a scale in her bathroom. Pt lives alone.     Objective:  Neurological Exam  Physical Exam Physical Examination:   Vitals:   07/30/17 1005  BP: (!) 154/70  Pulse: 65   General Examination: The patient is a very pleasant 84 y.o. female in no acute distress she appears frail. She is well groomed. She appears fairly well-nourished and well-hydrated.  HEENT: Normocephalic, atraumatic, pupils are equal, round and reactive to light and accommodation.  extraocular tracking is good. Face is symmetric.  Speech is clear. Airway examination is benign. Tongue protrudes centrally and palate elevates symmetrically.   Chest: Clear to auscultation without wheezing, rhonchi or crackles noted.  Heart: S1+S2+0, regular and normal without murmurs, rubs or gallops noted.   Abdomen: Soft, non-tender and non-distended with normal bowel sounds appreciated on auscultation.  Extremities: There is 1+ pitting edema in the distal lower extremities bilaterally.  she wears compression socks bilaterally.   Skin: Warm and dry without trophic changes noted.  age-related changes are visible on her face and scalp.  Musculoskeletal: exam reveals no obvious joint deformities, tenderness or joint swelling or erythema, with the exception of significant pain and limitation of passive and active mobility in the right shoulder. She is practically unable to extend her right arm to any degree. Her right shoulder elevation is also impaired.  Neurologically:  Mental status: The patient is awake, alert and oriented in all 4 spheres. Her immediate and remote memory, attention, language skills and fund of knowledge are appropriate. There is no evidence of aphasia, agnosia, apraxia or anomia. Speech is clear with normal prosody and enunciation. Thought process is linear. Mood is normal and affect is normal.  Cranial nerves II - XII are as described above under HEENT exam.  Motor exam reveals normal bulk, adequate strength for age with the exception of right proximal upper extremity with significant pain limitation noted. Reflexes are 1+ throughout. Fine motor skills and coordination: intact  grossly, for age.   Cerebellar testing: No dysmetria or intention tremor. There is no truncal or gait ataxia.  Sensory exam: intact to light touch.  Gait, station and balance: she stands up with difficulty and has difficulty pushing herself up because of limitation with her right arm. She walks with a cane which she holds in the right hand. She  does not have a limp or foot drop. She walks slowly and turns cautiously.   Assessment and Plan:  Assessment and Plan:  In summary, Kambra Beachem Tullius is a very pleasant 84 y.o.-year old female with an underlying medical history of hyperlipidemia, complete heart block with status post pacemaker placement, chronic back pain and neck pain, hypertension, hyperlipidemia, and mildly overweight state, who presents for neurologic consultation of her recent falls. She injured her right shoulder as I understand. Unfortunately, she has not yet had an x-ray on it. Her primary care physician apparently did order an x-ray yesterday. She is unclear as to where to go for this. I do not see an order in Epic. She is advised to find out from her PCP on her way home today. Her niece has been helpful and will take it upon her to find out today. She does not have a focal neurologically exam. She has most likely a nonspecific and multifactorial gait disorder and is at fall risk. We talked about fall risk and injuries quite a bit today. She is advised to use her walker, she has one at home. She is also advised to stay well-hydrated, change positions slowly. She does live alone which may be a concern down the Road. She has a call alert button and has several nieces and nephews involved in her care. She is advised to not drive at this time. I will order a head CT because she had a recent fall and fell onto her right side. A head injury is not completely excluded. She had a head CT several years ago and findings can be compared. We will call her with her test results. I will see her back on an as-needed basis. I answered all their questions today and the patient and Krista Ray were in agreement.  Thank you very much for allowing me to participate in the care of this nice patient. If I can be of any further assistance to you please do not hesitate to call me at [phone].  Sincerely,   Huston Foley, MD, PhD

## 2017-07-30 NOTE — Patient Instructions (Addendum)
As discussed, gait safety is key.  I do not see a focal issue neurologically.  Please use your walker, talk to Dr. Cliffton AstersWhite about doing that X ray on the right shoulder. I am concerned about your inability to move your right shoulder.  Please do not drive at this time.  We will do a CT head to compare with previous CT in 2011. We will call you or Krista Ray with the results. I will see you back as needed.

## 2017-08-06 ENCOUNTER — Telehealth: Payer: Self-pay | Admitting: Cardiology

## 2017-08-06 ENCOUNTER — Ambulatory Visit (INDEPENDENT_AMBULATORY_CARE_PROVIDER_SITE_OTHER): Payer: Medicare Other | Admitting: *Deleted

## 2017-08-06 DIAGNOSIS — I442 Atrioventricular block, complete: Secondary | ICD-10-CM

## 2017-08-06 NOTE — Telephone Encounter (Signed)
Confirmed remote transmission w/ pt niece.   

## 2017-08-06 NOTE — Progress Notes (Signed)
Remote pacemaker transmission.   

## 2017-08-07 ENCOUNTER — Encounter: Payer: Self-pay | Admitting: Cardiology

## 2017-08-11 ENCOUNTER — Ambulatory Visit
Admission: RE | Admit: 2017-08-11 | Discharge: 2017-08-11 | Disposition: A | Discharge status: Home / Self Care | Payer: Medicare Other | Source: Ambulatory Visit | Attending: Family Medicine | Admitting: Family Medicine

## 2017-08-11 ENCOUNTER — Other Ambulatory Visit: Payer: Self-pay | Admitting: Family Medicine

## 2017-08-11 ENCOUNTER — Ambulatory Visit
Admission: RE | Admit: 2017-08-11 | Discharge: 2017-08-11 | Disposition: A | Discharge status: Home / Self Care | Payer: Medicare Other | Source: Ambulatory Visit | Attending: Neurology | Admitting: Neurology

## 2017-08-11 DIAGNOSIS — M25511 Pain in right shoulder: Secondary | ICD-10-CM

## 2017-08-11 DIAGNOSIS — W19XXXS Unspecified fall, sequela: Secondary | ICD-10-CM

## 2017-08-11 DIAGNOSIS — S4991XS Unspecified injury of right shoulder and upper arm, sequela: Secondary | ICD-10-CM

## 2017-08-11 DIAGNOSIS — R2689 Other abnormalities of gait and mobility: Secondary | ICD-10-CM

## 2017-08-11 NOTE — Progress Notes (Signed)
Please call patient regarding her recent head CT, which showed, thankfully, no acute findings, stable appearance of a small old stroke on the L side of the brain that was visible on her CT head from 2011. She does have some evidence of hardening of the arteries type changes which have progressed with time which would be expected compared to 2011. As discussed, she can follow-up with me on an as-needed basis. Huston FoleySaima Keric Zehren, MD, PhD [facility]

## 2017-08-13 ENCOUNTER — Telehealth: Payer: Self-pay

## 2017-08-13 NOTE — Telephone Encounter (Signed)
I called pt's niece, Marti SleighMarcia Willis, per DPR.  I advised her that pt's CT head did not show any acute findings. A stable old stroke was noted on the L side of her brain, which was visibile in the CT from 2011, and there was evidence of hardening of the arteries which have progressed with time, which is to be expected since 2011. Dr. Frances FurbishAthar recommends a PRN follow up. Pt's niece verbalized understanding of results and will pass this message along to the pt. Pt's niece had no questions at this time but was encouraged to call back if questions arise.

## 2017-08-13 NOTE — Telephone Encounter (Signed)
-----   Message from Huston FoleySaima Athar, MD sent at 08/11/2017  4:47 PM EST ----- Please call patient regarding her recent head CT, which showed, thankfully, no acute findings, stable appearance of a small old stroke on the L side of the brain that was visible on her CT head from 2011. She does have some evidence of hardening of the arteries type changes which have progressed with time which would be expected compared to 2011. As discussed, she can follow-up with me on an as-needed basis. Huston FoleySaima Athar, MD, PhD [facility]

## 2017-09-01 LAB — CUP PACEART REMOTE DEVICE CHECK
Battery Remaining Longevity: 79 mo
Brady Statistic AP VS Percent: 1 %
Brady Statistic AS VP Percent: 69 %
Brady Statistic RA Percent Paced: 29 %
Brady Statistic RV Percent Paced: 99 %
Date Time Interrogation Session: 20190123203000
Implantable Lead Implant Date: 20110103
Implantable Lead Location: 753859
Implantable Lead Location: 753860
Lead Channel Impedance Value: 550 Ohm
Lead Channel Pacing Threshold Amplitude: 0.75 V
Lead Channel Pacing Threshold Pulse Width: 0.4 ms
Lead Channel Sensing Intrinsic Amplitude: 9.9 mV
Lead Channel Setting Pacing Amplitude: 1.5 V
Lead Channel Setting Pacing Pulse Width: 0.4 ms
Lead Channel Setting Sensing Sensitivity: 2.5 mV
MDC IDC LEAD IMPLANT DT: 20110103
MDC IDC MSMT BATTERY REMAINING PERCENTAGE: 57 %
MDC IDC MSMT BATTERY VOLTAGE: 2.89 V
MDC IDC MSMT LEADCHNL RA IMPEDANCE VALUE: 400 Ohm
MDC IDC MSMT LEADCHNL RA PACING THRESHOLD AMPLITUDE: 0.5 V
MDC IDC MSMT LEADCHNL RA SENSING INTR AMPL: 5 mV
MDC IDC MSMT LEADCHNL RV PACING THRESHOLD PULSEWIDTH: 0.4 ms
MDC IDC PG IMPLANT DT: 20110103
MDC IDC SET LEADCHNL RV PACING AMPLITUDE: 1 V
MDC IDC STAT BRADY AP VP PERCENT: 31 %
MDC IDC STAT BRADY AS VS PERCENT: 1 %
Pulse Gen Model: 2110
Pulse Gen Serial Number: 7098440

## 2017-09-23 ENCOUNTER — Ambulatory Visit
Admission: RE | Admit: 2017-09-23 | Discharge: 2017-09-23 | Disposition: A | Discharge status: Home / Self Care | Payer: Medicare Other | Source: Ambulatory Visit | Attending: Family Medicine | Admitting: Family Medicine

## 2017-09-23 ENCOUNTER — Other Ambulatory Visit: Payer: Self-pay | Admitting: Family Medicine

## 2017-09-23 DIAGNOSIS — S8001XA Contusion of right knee, initial encounter: Secondary | ICD-10-CM

## 2017-10-14 ENCOUNTER — Telehealth: Payer: Self-pay | Admitting: Cardiovascular Disease

## 2017-10-14 MED ORDER — EZETIMIBE 10 MG PO TABS
10.0000 mg | ORAL_TABLET | Freq: Every day | ORAL | 3 refills | Status: DC
Start: 2017-10-14 — End: 2018-09-11

## 2017-10-14 NOTE — Telephone Encounter (Signed)
New message    *STAT* If patient is at the pharmacy, call can be transferred to refill team.   1. Which medications need to be refilled? (please list name of each medication and dose if known) ezetimibe (ZETIA) 10 MG tablet  2. Which pharmacy/location (including street and city if local pharmacy) is medication to be sent to?[id number] - [place], [place] - [address]  3. Do they need a 30 day or 90 day supply? 90

## 2017-10-27 ENCOUNTER — Other Ambulatory Visit: Payer: Self-pay | Admitting: Cardiovascular Disease

## 2017-11-05 ENCOUNTER — Telehealth: Payer: Self-pay | Admitting: Cardiology

## 2017-11-05 ENCOUNTER — Ambulatory Visit (INDEPENDENT_AMBULATORY_CARE_PROVIDER_SITE_OTHER): Payer: Medicare Other | Admitting: *Deleted

## 2017-11-05 DIAGNOSIS — I442 Atrioventricular block, complete: Secondary | ICD-10-CM

## 2017-11-05 NOTE — Telephone Encounter (Signed)
Spoke with pt and reminded pt of remote transmission that is due today. Pt verbalized understanding.   

## 2017-11-07 ENCOUNTER — Encounter: Payer: Self-pay | Admitting: Cardiology

## 2017-11-07 NOTE — Progress Notes (Signed)
Remote pacemaker transmission.   

## 2017-11-10 LAB — CUP PACEART REMOTE DEVICE CHECK
Implantable Lead Implant Date: 20110103
Implantable Pulse Generator Implant Date: 20110103
MDC IDC LEAD IMPLANT DT: 20110103
MDC IDC LEAD LOCATION: 753859
MDC IDC LEAD LOCATION: 753860
MDC IDC PG SERIAL: 7098440
MDC IDC SESS DTM: 20190429103329

## 2017-11-14 ENCOUNTER — Ambulatory Visit: Payer: Medicare Other | Admitting: Podiatry

## 2018-01-13 ENCOUNTER — Other Ambulatory Visit: Payer: Self-pay | Admitting: Cardiovascular Disease

## 2018-01-15 ENCOUNTER — Other Ambulatory Visit: Payer: Self-pay | Admitting: Cardiovascular Disease

## 2018-02-04 ENCOUNTER — Encounter: Payer: Medicare Other | Admitting: *Deleted

## 2018-02-04 ENCOUNTER — Telehealth: Payer: Self-pay | Admitting: Cardiology

## 2018-02-04 NOTE — Telephone Encounter (Signed)
LMOVM reminding pt to send remote transmission.   

## 2018-02-05 ENCOUNTER — Encounter: Payer: Self-pay | Admitting: Cardiology

## 2018-02-12 ENCOUNTER — Ambulatory Visit (INDEPENDENT_AMBULATORY_CARE_PROVIDER_SITE_OTHER): Payer: Medicare Other | Admitting: *Deleted

## 2018-02-12 ENCOUNTER — Other Ambulatory Visit: Payer: Self-pay | Admitting: Cardiovascular Disease

## 2018-02-12 ENCOUNTER — Encounter: Payer: Self-pay | Admitting: Cardiology

## 2018-02-12 DIAGNOSIS — I442 Atrioventricular block, complete: Secondary | ICD-10-CM | POA: Diagnosis not present

## 2018-02-12 NOTE — Progress Notes (Signed)
Remote pacemaker transmission.   

## 2018-02-16 ENCOUNTER — Other Ambulatory Visit: Payer: Self-pay | Admitting: Cardiovascular Disease

## 2018-02-16 ENCOUNTER — Telehealth: Payer: Self-pay | Admitting: Cardiovascular Disease

## 2018-02-16 NOTE — Telephone Encounter (Signed)
New message  Patient's niece Marti SleighMarcia Willis calling to request refill on fluid pill. She thinks the medication is Lasix, but unsure.    Pt c/o swelling: STAT is pt has developed SOB within 24 hours  1) How much weight have you gained and in what time span? N/A  2) If swelling, where is the swelling located? FEET, ANKLES, LEGS  Are you currently taking a fluid pill? NO 3) Are you currently SOB? NO 4) Do you have a log of your daily weights (if so, list)? NO  5) Have you gained 3 pounds in a day or 5 pounds in a week?   6) Have you traveled recently? NO

## 2018-02-16 NOTE — Telephone Encounter (Signed)
Returned triage call and spoke with patient. Patient request that I speak with her niece Leta JunglingMarcia. Leta JunglingMarcia stats that the patient has been out of her HCTZ for 3-4 weeks and is having some LE swelling. No sob, palpitations, chest pain, syncope. Gerilyn NestleAdv Marcia that the triage message stated that she was asking for a refill for Lasix. Gerilyn NestleAdv Marcia that the patient has not been prescribed Lasix recently. Leta JunglingMarcia sts that she said Lasix in error, the pt needs HCTZ refilled.  Gerilyn NestleAdv Marcia that the HCTZ was refilled this morning.  She will pick it up today and have the patient resume. Adv her to have the pt limit sodium, and elevate her LE whenever sitting. Adv them to call the office the end of the week in no improvement or other symptoms develop. Leta JunglingMarcia agreeable with plan and verbalized understanding.

## 2018-03-14 ENCOUNTER — Other Ambulatory Visit: Payer: Self-pay | Admitting: Cardiovascular Disease

## 2018-03-23 ENCOUNTER — Other Ambulatory Visit: Payer: Self-pay | Admitting: Cardiovascular Disease

## 2018-03-26 ENCOUNTER — Other Ambulatory Visit: Payer: Self-pay | Admitting: Cardiovascular Disease

## 2018-03-26 LAB — CUP PACEART REMOTE DEVICE CHECK
Battery Remaining Longevity: 72 mo
Battery Remaining Percentage: 51 %
Battery Voltage: 2.87 V
Brady Statistic AS VP Percent: 71 %
Brady Statistic RA Percent Paced: 27 %
Brady Statistic RV Percent Paced: 99 %
Date Time Interrogation Session: 20190912162932
Implantable Lead Implant Date: 20110103
Implantable Lead Location: 753860
Lead Channel Impedance Value: 380 Ohm
Lead Channel Pacing Threshold Amplitude: 0.5 V
Lead Channel Pacing Threshold Amplitude: 0.875 V
Lead Channel Pacing Threshold Pulse Width: 0.4 ms
Lead Channel Sensing Intrinsic Amplitude: 12 mV
Lead Channel Setting Pacing Amplitude: 1.5 V
Lead Channel Setting Sensing Sensitivity: 2.5 mV
MDC IDC LEAD IMPLANT DT: 20110103
MDC IDC LEAD LOCATION: 753859
MDC IDC MSMT LEADCHNL RA SENSING INTR AMPL: 1.5 mV
MDC IDC MSMT LEADCHNL RV IMPEDANCE VALUE: 440 Ohm
MDC IDC MSMT LEADCHNL RV PACING THRESHOLD PULSEWIDTH: 0.4 ms
MDC IDC PG IMPLANT DT: 20110103
MDC IDC SET LEADCHNL RV PACING AMPLITUDE: 0.875
MDC IDC SET LEADCHNL RV PACING PULSEWIDTH: 0.4 ms
MDC IDC STAT BRADY AP VP PERCENT: 29 %
MDC IDC STAT BRADY AP VS PERCENT: 1 %
MDC IDC STAT BRADY AS VS PERCENT: 1 %
Pulse Gen Model: 2110
Pulse Gen Serial Number: 7098440

## 2018-03-30 ENCOUNTER — Telehealth: Payer: Self-pay | Admitting: Cardiovascular Disease

## 2018-03-30 NOTE — Telephone Encounter (Signed)
Tried to call, man answered and asked to CB later

## 2018-03-30 NOTE — Telephone Encounter (Signed)
New message   Pt c/o swelling: STAT is pt has developed SOB within 24 hours  1) How much weight have you gained and in what time span? She does know  2) If swelling, where is the swelling located? Both feet and ankles  3) Are you currently taking a fluid pill? yes  4) Are you currently SOB?no   5) Do you have a log of your daily weights (if so, list)? No   6) Have you gained 3 pounds in a day or 5 pounds in a week? No   7) Have you traveled recently? No

## 2018-03-31 NOTE — Telephone Encounter (Signed)
Returned call to Brink's CompanyMARCIA M WILLIS(NIECE) (DPR)] she states that pt has had LE swelling for more than a week she denies any other sx SOB, CP or pressure, or any other sx. Made appt 9-23 w/[facility] 1130am to discuss further options for LE swelling

## 2018-03-31 NOTE — Telephone Encounter (Signed)
Follow up:   Patient niece returning call about her aunt Please call this number Ms. Leta JunglingMarcia  [phone]. Niece Stated no one returned her call.

## 2018-04-06 NOTE — Progress Notes (Deleted)
Cardiology Office Note:    Date:  04/06/2018   ID:  Krista Ray, DOB May 08, 1923, MRN [id number]  PCP:  Patient, No Pcp Per  Cardiologist:  No primary care provider on file.   Referring MD: No ref. provider found   No chief complaint on file. ***  History of Present Illness:    Krista Ray is a 84 y.o. female with a hx of third-degree AV heart block status post permanent pacemaker insertion on 07/17/2009 with a [employer] dual-chamber pacemaker.  He also has a history of hypertension, grade 1 diastolic dysfunction, hyperlipidemia, aortic valve sclerosis, mild to moderate tricuspid and mitral regurgitation by echo in 2011, osteoarthritis, and degenerative lumbar spine disease.  Nuclear stress test in 2011 showed normal perfusion.  She was last seen in clinic on 07/25/2017 by Dr. Tresa Endo.  At that time she was doing well.  She has been maintained on 2.5 mg of lisinopril.  She had urinary incontinence and did not tolerate HCTZ previously.  She is intolerant to statins, and is on Zetia.  She returns to clinic for follow-up.         Past Medical History:  Diagnosis Date  . Chronic back pain   . Chronic neck pain   . Coronary artery disease   . DDD (degenerative disc disease), cervical   . DDD (degenerative disc disease), lumbar   . Hyperlipemia   . Hypertension   . MI (myocardial infarction) (HCC)   . Presence of permanent cardiac pacemaker 07/17/2009   [employer]Jude  . Radiculopathy   . Sciatic nerve pain     Past Surgical History:  Procedure Laterality Date  . ABDOMINAL HYSTERECTOMY  1956  . APPENDECTOMY  1944  . BACK SURGERY  2004  . CATARACT EXTRACTION Right   . NM MYOCAR PERF WALL MOTION  09/06/2009   Normal  . PACEMAKER INSERTION  07/17/2009   left side-[employer].Jude  . US ECHOCARDIOGRAPHY  11/28/2011   Normal    Current Medications: No outpatient medications have been marked as taking for the 04/07/18 encounter (Appointment) with Marcelino Duster, PA.      Allergies:   Lovastatin; Penicillins; and Pravastatin sodium   Social History   Socioeconomic History  . Marital status: Widowed    Spouse name: Not on file  . Number of children: Not on file  . Years of education: Not on file  . Highest education level: Not on file  Occupational History  . Not on file  Social Needs  . Financial resource strain: Not on file  . Food insecurity:    Worry: Not on file    Inability: Not on file  . Transportation needs:    Medical: Not on file    Non-medical: Not on file  Tobacco Use  . Smoking status: Never Smoker  . Smokeless tobacco: Never Used  Substance and Sexual Activity  . Alcohol use: No  . Drug use: No  . Sexual activity: Never  Lifestyle  . Physical activity:    Days per week: Not on file    Minutes per session: Not on file  . Stress: Not on file  Relationships  . Social connections:    Talks on phone: Not on file    Gets together: Not on file    Attends religious service: Not on file    Active member of club or organization: Not on file    Attends meetings of clubs or organizations: Not on file    Relationship status: Not  on file  Other Topics Concern  . Not on file  Social History Narrative  . Not on file     Family History: The patient's ***family history includes Cancer in her brother; Heart disease in her father and sister; Hypertension in her father; Stroke in her father. There is no history of Diabetes.  ROS:   Please see the history of present illness.    *** All other systems reviewed and are negative.  EKGs/Labs/Other Studies Reviewed:    The following studies were reviewed today: ***  EKG:  EKG is *** ordered today.  The ekg ordered today demonstrates ***  Recent Labs: No results found for requested labs within last 8760 hours.  Recent Lipid Panel    Component Value Date/Time   CHOL 234 (H) 12/23/2016 0815   TRIG 93 12/23/2016 0815   HDL 73 12/23/2016 0815   CHOLHDL 3.2 12/23/2016 0815    CHOLHDL 3.1 08/15/2016 0956   VLDL 17 08/15/2016 0956   LDLCALC 142 (H) 12/23/2016 0815   LDLDIRECT 137.0 02/22/2013 1006    Physical Exam:    VS:  There were no vitals taken for this visit.    Wt Readings from Last 3 Encounters:  07/30/17 143 lb (64.9 kg)  07/25/17 142 lb (64.4 kg)  05/29/17 143 lb (64.9 kg)     GEN: *** Well nourished, well developed in no acute distress HEENT: Normal NECK: No JVD; No carotid bruits LYMPHATICS: No lymphadenopathy CARDIAC: ***RRR, no murmurs, rubs, gallops RESPIRATORY:  Clear to auscultation without rales, wheezing or rhonchi  ABDOMEN: Soft, non-tender, non-distended MUSCULOSKELETAL:  No edema; No deformity  SKIN: Warm and dry NEUROLOGIC:  Alert and oriented x 3 PSYCHIATRIC:  Normal affect   ASSESSMENT:    No diagnosis found. PLAN:    In order of problems listed above:  No diagnosis found.   Medication Adjustments/Labs and Tests Ordered: Current medicines are reviewed at length with the patient today.  Concerns regarding medicines are outlined above.  No orders of the defined types were placed in this encounter.  No orders of the defined types were placed in this encounter.   Signed, Marcelino Dusterngela Nicole Jermery [place], [place]  04/06/2018 11:33 AM    [facility]

## 2018-04-07 ENCOUNTER — Ambulatory Visit: Payer: Medicare Other | Admitting: Physician Assistant

## 2018-04-07 ENCOUNTER — Encounter: Payer: Self-pay | Admitting: Physician Assistant

## 2018-04-07 VITALS — BP 135/60 | HR 94 | Ht 61.0 in | Wt 140.4 lb

## 2018-04-07 DIAGNOSIS — I5032 Chronic diastolic (congestive) heart failure: Secondary | ICD-10-CM

## 2018-04-07 DIAGNOSIS — I442 Atrioventricular block, complete: Secondary | ICD-10-CM

## 2018-04-07 DIAGNOSIS — I10 Essential (primary) hypertension: Secondary | ICD-10-CM

## 2018-04-07 DIAGNOSIS — Z950 Presence of cardiac pacemaker: Secondary | ICD-10-CM | POA: Diagnosis not present

## 2018-04-07 MED ORDER — HYDROCHLOROTHIAZIDE 25 MG PO TABS: 25.0000 mg | ORAL_TABLET | Freq: Every day | ORAL | 1 refills | Status: DC

## 2018-04-07 NOTE — Progress Notes (Signed)
Cardiology Office Note:    Date:  04/07/2018   ID:  Krista Ray, DOB 1922-12-14, MRN [id number]  PCP:  Laurann Montana, MD  Cardiologist:  Nicki Guadalajara, MD   Referring MD: No ref. provider found   Chief Complaint  Patient presents with  . Leg Swelling    History of Present Illness:    Krista Ray is a 83 y.o. female with a hx of third-degree AV heart block status post permanent pacemaker insertion on 07/17/2009 with a [employer] Jude[employer] dual-chamber pacemaker.  He also has a history of hypertension, grade 1 diastolic dysfunction, hyperlipidemia, aortic valve sclerosis, mild to moderate tricuspid regurgitation and mitral regurgitation by echo in 2011, osteoarthritis, and degenerative lumbar spine disease.  Nuclear stress test in 2011 showed normal perfusion.  She was last seen in clinic on 07/25/2017 by Dr. Tresa Endo.  At that time she was doing well.  She has been maintained on 2.5 mg of lisinopril.  She had urinary incontinence and did not tolerate HCTZ previously.  She is intolerant to statins, and is on Zetia.  She returns to clinic with increased swelling in her lower extremities. She is here with her niece. Review of phone notes suggests she may have been out of HCTZ for several weeks. She has since resumed this, but is on an alternating dose of 25 mg one day and 12.5 mg the next day. There may have been some confusion about her dosing schedule. Creatinine in Jan 2019 was normal. She also complains of right groin pain whenever she stands from a sitting position.  This is her main concern.  She is not weighing daily.  She denies shortness of breath, dyspnea on exertion, orthopnea, dizziness, and syncope. She denies chest pain. She lives at home with her son. She is very mindful of her salt intake.    Past Medical History:  Diagnosis Date  . Chronic back pain   . Chronic neck pain   . Coronary artery disease   . DDD (degenerative disc disease), cervical   . DDD (degenerative disc disease),  lumbar   . Hyperlipemia   . Hypertension   . MI (myocardial infarction) (HCC)   . Presence of permanent cardiac pacemaker 07/17/2009   [employer].Jude  . Radiculopathy   . Sciatic nerve pain     Past Surgical History:  Procedure Laterality Date  . ABDOMINAL HYSTERECTOMY  1956  . APPENDECTOMY  1944  . BACK SURGERY  2004  . CATARACT EXTRACTION Right   . NM MYOCAR PERF WALL MOTION  09/06/2009   Normal  . PACEMAKER INSERTION  07/17/2009   left side-[employer].Jude  . US ECHOCARDIOGRAPHY  11/28/2011   Normal    Current Medications: Current Meds  Medication Sig  . aspirin 81 MG tablet Take 1 tablet by mouth daily.  Marland Kitchen ezetimibe (ZETIA) 10 MG tablet Take 1 tablet (10 mg total) by mouth daily.  Marland Kitchen latanoprost (XALATAN) 0.005 % ophthalmic solution Place 1 drop into both eyes as needed.   Marland Kitchen lisinopril (PRINIVIL,ZESTRIL) 2.5 MG tablet TAKE 1 TABLET BY MOUTH EVERY DAY  . metoprolol succinate (TOPROL-XL) 25 MG 24 hr tablet take 1 tablet by mouth once daily  . [DISCONTINUED] hydrochlorothiazide (MICROZIDE) 12.5 MG capsule ALTERNATE TAKING 25MG  AND 12.5 MG EVERY OTHER DAY.     Allergies:   Lovastatin; Penicillins; and Pravastatin sodium   Social History   Socioeconomic History  . Marital status: Widowed    Spouse name: Not on file  . Number of children: Not  on file  . Years of education: Not on file  . Highest education level: Not on file  Occupational History  . Not on file  Social Needs  . Financial resource strain: Not on file  . Food insecurity:    Worry: Not on file    Inability: Not on file  . Transportation needs:    Medical: Not on file    Non-medical: Not on file  Tobacco Use  . Smoking status: Never Smoker  . Smokeless tobacco: Never Used  Substance and Sexual Activity  . Alcohol use: No  . Drug use: No  . Sexual activity: Never  Lifestyle  . Physical activity:    Days per week: Not on file    Minutes per session: Not on file  . Stress: Not on file  Relationships  . Social  connections:    Talks on phone: Not on file    Gets together: Not on file    Attends religious service: Not on file    Active member of club or organization: Not on file    Attends meetings of clubs or organizations: Not on file    Relationship status: Not on file  Other Topics Concern  . Not on file  Social History Narrative  . Not on file     Family History: The patient's family history includes Cancer in her brother; Heart disease in her father and sister; Hypertension in her father; Stroke in her father. There is no history of Diabetes.  ROS:   Please see the history of present illness.     All other systems reviewed and are negative.  EKGs/Labs/Other Studies Reviewed:    The following studies were reviewed today:  Echo 2017 Study Conclusions - Left ventricle: The cavity size was normal. Systolic function was   normal. The estimated ejection fraction was in the range of 60%   to 65%. Wall motion was normal; there were no regional wall   motion abnormalities. Doppler parameters are consistent with   abnormal left ventricular relaxation (grade 1 diastolic   dysfunction). Doppler parameters are consistent with high   ventricular filling pressure. - Aortic valve: Transvalvular velocity was within the normal range.   There was no stenosis. There was no regurgitation. - Mitral valve: There was trivial regurgitation. - Right ventricle: The cavity size was normal. Wall thickness was   normal. Systolic function was normal. - Tricuspid valve: There was trivial regurgitation. - Pulmonary arteries: Systolic pressure was within the normal   range. PA peak pressure: 25 mm Hg (S).  EKG:  EKG is not ordered today.    Recent Labs: No results found for requested labs within last 8760 hours.  Recent Lipid Panel    Component Value Date/Time   CHOL 234 (H) 12/23/2016 0815   TRIG 93 12/23/2016 0815   HDL 73 12/23/2016 0815   CHOLHDL 3.2 12/23/2016 0815   CHOLHDL 3.1 08/15/2016 0956    VLDL 17 08/15/2016 0956   LDLCALC 142 (H) 12/23/2016 0815   LDLDIRECT 137.0 02/22/2013 1006    Physical Exam:    VS:  BP 135/60   Pulse 94   Ht 5\' 1"  (1.549 m)   Wt 140 lb 6.4 oz (63.7 kg)   BMI 26.53 kg/m     Wt Readings from Last 3 Encounters:  04/07/18 140 lb 6.4 oz (63.7 kg)  07/30/17 143 lb (64.9 kg)  07/25/17 142 lb (64.4 kg)     GEN: elderly AA female in no acute distress  HEENT: Normal NECK: No JVD; No carotid bruits CARDIAC: RRR, no murmurs, rubs, gallops RESPIRATORY:  Clear to auscultation without rales, wheezing or rhonchi  ABDOMEN: Soft, non-tender, non-distended MUSCULOSKELETAL:  1+ B LE edema; No deformity  SKIN: Warm and dry NEUROLOGIC:  Alert and oriented x 3 PSYCHIATRIC:  Normal affect   ASSESSMENT:    1. Chronic diastolic heart failure (HCC)   2. Essential hypertension   3. Third degree atrioventricular block (HCC)   4. Pacemaker    PLAN:    In order of problems listed above:  Chronic diastolic heart failure (HCC) It sounds as though she may have been out of her HCTZ for several weeks.  And then there is a question of some confusion regarding her dosing schedule.  I will increase her HCTZ to 25 mg daily.  I will see her back in 3 weeks for repeat labs. Because she denies shortness of breath and orthopnea, I will defer repeat ultrasound at this time.  We will see if we can resume her medication with some improvement in her edema.  I have also asked her to weigh daily and to bring this log to her next visit.  She is in agreement with this plan.  Essential hypertension Pressure well-controlled.  Third degree atrioventricular block (HCC) Pacemaker Stable.   Right groin pain on standing I've asked her to follow up with her PCP about this.   Follow up in 3 weeks with BMP.   Medication Adjustments/Labs and Tests Ordered: Current medicines are reviewed at length with the patient today.  Concerns regarding medicines are outlined above.  No  orders of the defined types were placed in this encounter.  Meds ordered this encounter  Medications  . hydrochlorothiazide (HYDRODIURIL) 25 MG tablet    Sig: Take 1 tablet (25 mg total) by mouth daily.    Dispense:  90 tablet    Refill:  1    Dosage increase    Signed, Marcelino Dusterngela Nicole [facility], [place]  04/07/2018 12:17 PM     [facility]

## 2018-04-07 NOTE — Patient Instructions (Addendum)
Medication Instructions:  Your physician has recommended you make the following change in your medication INCREASE HCTZ to 25mg  daily. An Rx has been sent to your pharmacy   Labwork: None ordered  Testing/Procedures: None ordered  Follow-Up: You have a follow up appointment scheduled on 04/23/18 @ 10am with Angie [facility],PA    Any Other Special Instructions Will Be Listed Below (If Applicable). Your physician recommends that you weigh, daily, at the same time every day, and in the same amount of clothing. Please record your daily weights.  Please call the office to let Angie know if your weight does nor start to go down      If you need a refill on your cardiac medications before your next appointment, please call your pharmacy.

## 2018-04-09 ENCOUNTER — Encounter: Payer: Self-pay | Admitting: Physician Assistant

## 2018-04-13 ENCOUNTER — Other Ambulatory Visit: Payer: Self-pay | Admitting: Family Medicine

## 2018-04-13 DIAGNOSIS — R1030 Lower abdominal pain, unspecified: Secondary | ICD-10-CM

## 2018-04-16 ENCOUNTER — Ambulatory Visit
Admission: RE | Admit: 2018-04-16 | Discharge: 2018-04-16 | Disposition: A | Discharge status: Home / Self Care | Payer: Medicare Other | Source: Ambulatory Visit | Attending: Family Medicine | Admitting: Family Medicine

## 2018-04-16 DIAGNOSIS — R1030 Lower abdominal pain, unspecified: Secondary | ICD-10-CM

## 2018-04-16 MED ORDER — IOPAMIDOL (ISOVUE-300) INJECTION 61%
100.0000 mL | Freq: Once | INTRAVENOUS | Status: AC | PRN
  Administered 2018-04-16: 100 mL via INTRAVENOUS

## 2018-04-21 NOTE — Progress Notes (Signed)
Cardiology Office Note:    Date:  04/23/2018   ID:  Krista Ray, DOB 07/05/1923, MRN [id number]  PCP:  Laurann Montana, MD  Cardiologist:  Nicki Guadalajara, MD   Referring MD: Laurann Montana, MD   Chief Complaint  Patient presents with  . Edema    feet swelling    History of Present Illness:    Krista Ray is a 84 y.o. female with a hx of third-degree AV heart block status post permanent pacemaker insertion on 07/17/2009 with a St. Jude's dual-chamber pacemaker.  She also has a history of hypertension, grade 1 diastolic dysfunction, hyperlipidemia, aortic valve sclerosis, mild to moderate tricuspid regurgitation and mitral regurgitation by echo in 2011, osteoarthritis, and degenerative lumbar spine disease.  Nuclear stress test in 2011 showed normal perfusion.  She was last seen in clinic on 07/25/2017 by Dr. Tresa Endo.  At that time she was doing well.  She has been maintained on 2.5 mg of lisinopril.  She had urinary incontinence and did not tolerate HCTZ previously.  She is intolerant to statins, and is on Zetia. I saw her in follow up 04/07/18 for increased swelling in her extremities. She had been out of her HCTZ for several weeks but restarted. I increased her HCTZ from alternating between 12.5 and 25 mg to 25 mg daily.   She returns today for follow up. She is here with her niece. She states the swelling is much better in her lower legs. She weighs daily and is 140 lbs at home and has not had a weight increase. She is doing well on 25 mg HCTZ daily. I will check BMP today. Her main complaint is trouble sleeping. She denies orthopnea and states she sometimes doesn't know what day it is. She also wants to start doing water aerobics again.    Past Medical History:  Diagnosis Date  . Chronic back pain   . Chronic neck pain   . Coronary artery disease   . DDD (degenerative disc disease), cervical   . DDD (degenerative disc disease), lumbar   . Hyperlipemia   . Hypertension   . MI  (myocardial infarction) (HCC)   . Presence of permanent cardiac pacemaker 07/17/2009   St.Jude  . Radiculopathy   . Sciatic nerve pain     Past Surgical History:  Procedure Laterality Date  . ABDOMINAL HYSTERECTOMY  1956  . APPENDECTOMY  1944  . BACK SURGERY  2004  . CATARACT EXTRACTION Right   . NM MYOCAR PERF WALL MOTION  09/06/2009   Normal  . PACEMAKER INSERTION  07/17/2009   left side-St.Jude  . US ECHOCARDIOGRAPHY  11/28/2011   Normal    Current Medications: Current Meds  Medication Sig  . aspirin 81 MG tablet Take 1 tablet by mouth daily.  Marland Kitchen ezetimibe (ZETIA) 10 MG tablet Take 1 tablet (10 mg total) by mouth daily.  . hydrochlorothiazide (HYDRODIURIL) 25 MG tablet Take 1 tablet (25 mg total) by mouth daily.  Marland Kitchen lisinopril (PRINIVIL,ZESTRIL) 2.5 MG tablet TAKE 1 TABLET BY MOUTH EVERY DAY  . metoprolol succinate (TOPROL-XL) 25 MG 24 hr tablet take 1 tablet by mouth once daily     Allergies:   Lovastatin; Penicillins; and Pravastatin sodium   Social History   Socioeconomic History  . Marital status: Widowed    Spouse name: Not on file  . Number of children: Not on file  . Years of education: Not on file  . Highest education level: Not on file  Occupational History  .  Not on file  Social Needs  . Financial resource strain: Not on file  . Food insecurity:    Worry: Not on file    Inability: Not on file  . Transportation needs:    Medical: Not on file    Non-medical: Not on file  Tobacco Use  . Smoking status: Never Smoker  . Smokeless tobacco: Never Used  Substance and Sexual Activity  . Alcohol use: No  . Drug use: No  . Sexual activity: Never  Lifestyle  . Physical activity:    Days per week: Not on file    Minutes per session: Not on file  . Stress: Not on file  Relationships  . Social connections:    Talks on phone: Not on file    Gets together: Not on file    Attends religious service: Not on file    Active member of club or organization: Not on  file    Attends meetings of clubs or organizations: Not on file    Relationship status: Not on file  Other Topics Concern  . Not on file  Social History Narrative  . Not on file     Family History: The patient's family history includes Cancer in her brother; Heart disease in her father and sister; Hypertension in her father; Stroke in her father. There is no history of Diabetes.  ROS:   Please see the history of present illness.     All other systems reviewed and are negative.  EKGs/Labs/Other Studies Reviewed:    The following studies were reviewed today:  Echo 02/19/16: Study Conclusions  - Left ventricle: The cavity size was normal. Systolic function was   normal. The estimated ejection fraction was in the range of 60%   to 65%. Wall motion was normal; there were no regional wall   motion abnormalities. Doppler parameters are consistent with   abnormal left ventricular relaxation (grade 1 diastolic   dysfunction). Doppler parameters are consistent with high   ventricular filling pressure. - Aortic valve: Transvalvular velocity was within the normal range.   There was no stenosis. There was no regurgitation. - Mitral valve: There was trivial regurgitation. - Right ventricle: The cavity size was normal. Wall thickness was   normal. Systolic function was normal. - Tricuspid valve: There was trivial regurgitation. - Pulmonary arteries: Systolic pressure was within the normal   range. PA peak pressure: 25 mm Hg (S).  EKG:  EKG is ordered today.  The ekg ordered today demonstrates paced rhythm.  Recent Labs: No results found for requested labs within last 8760 hours.  Recent Lipid Panel    Component Value Date/Time   CHOL 234 (H) 12/23/2016 0815   TRIG 93 12/23/2016 0815   HDL 73 12/23/2016 0815   CHOLHDL 3.2 12/23/2016 0815   CHOLHDL 3.1 08/15/2016 0956   VLDL 17 08/15/2016 0956   LDLCALC 142 (H) 12/23/2016 0815   LDLDIRECT 137.0 02/22/2013 1006    Physical Exam:     VS:  BP 125/65   Pulse 61   Ht 5\' 1"  (1.549 m)   Wt 139 lb 12.8 oz (63.4 kg)   BMI 26.41 kg/m     Wt Readings from Last 3 Encounters:  04/23/18 139 lb 12.8 oz (63.4 kg)  04/07/18 140 lb 6.4 oz (63.7 kg)  07/30/17 143 lb (64.9 kg)     GEN: Well nourished, well developed in no acute distress HEENT: Normal NECK: No JVD; No carotid bruits CARDIAC: RRR, + murmurs, rubs, gallops  RESPIRATORY:  Clear to auscultation without rales, wheezing or rhonchi  ABDOMEN: Soft, non-tender, non-distended MUSCULOSKELETAL:  Trace edema; No deformity  SKIN: Warm and dry NEUROLOGIC:  Alert and oriented x 3 PSYCHIATRIC:  Normal affect   ASSESSMENT:    1. Chronic diastolic heart failure (HCC)   2. Essential hypertension   3. Pure hypercholesterolemia    PLAN:    In order of problems listed above:  Chronic diastolic heart failure (HCC) - Plan: EKG 12-Lead, Basic metabolic panel She is doing well on 25 mg of HCTZ daily. She denies SOB, DOE, and orthopnea.  I will collected BMP today.  Her dry weight appears to be 140 pounds.  No medication changes today.   Essential hypertension - Plan: EKG 12-Lead, Basic metabolic panel Pressure is well controlled.  No medication changes.   Pure hypercholesterolemia - Plan: EKG 12-Lead, Basic metabolic panel Continue Zetia.  She has been intolerant to statins.   Follow-up with Dr. Tresa Endo in 6 months.    Medication Adjustments/Labs and Tests Ordered: Current medicines are reviewed at length with the patient today.  Concerns regarding medicines are outlined above.  Orders Placed This Encounter  Procedures  . Basic metabolic panel  . EKG 12-Lead   No orders of the defined types were placed in this encounter.   Signed, Roe Rutherford Duke, PA  04/23/2018 2:01 PM    [facility]

## 2018-04-23 ENCOUNTER — Encounter: Payer: Self-pay | Admitting: Physician Assistant

## 2018-04-23 ENCOUNTER — Ambulatory Visit (INDEPENDENT_AMBULATORY_CARE_PROVIDER_SITE_OTHER): Payer: Medicare Other | Admitting: Physician Assistant

## 2018-04-23 ENCOUNTER — Ambulatory Visit: Payer: Medicare Other | Admitting: Physician Assistant

## 2018-04-23 VITALS — BP 125/65 | HR 61 | Ht 61.0 in | Wt 139.8 lb

## 2018-04-23 DIAGNOSIS — I5032 Chronic diastolic (congestive) heart failure: Secondary | ICD-10-CM | POA: Diagnosis not present

## 2018-04-23 DIAGNOSIS — E7800 Pure hypercholesterolemia, unspecified: Secondary | ICD-10-CM | POA: Diagnosis not present

## 2018-04-23 DIAGNOSIS — I10 Essential (primary) hypertension: Secondary | ICD-10-CM

## 2018-04-23 NOTE — Patient Instructions (Signed)
Medication Instructions:  NONE  If you need a refill on your cardiac medications before your next appointment, please call your pharmacy.   Lab work: Your physician recommends that you return for lab work today: BMET   If you have labs (blood work) drawn today and your tests are completely normal, you will receive your results only by: Marland Kitchen MyChart Message (if you have MyChart) OR . A paper copy in the mail If you have any lab test that is abnormal or we need to change your treatment, we will call you to review the results.  Testing/Procedures: NONE  Follow-Up: At [facility], you and your health needs are our priority.  As part of our continuing mission to provide you with exceptional heart care, we have created designated Provider Care Teams.  These Care Teams include your primary Cardiologist (physician) and Advanced Practice Providers (APPs -  Physician Assistants and Nurse Practitioners) who all work together to provide you with the care you need, when you need it. You will need a follow up appointment in 6 months.  Please call our office 2 months in advance to schedule this appointment.  You may see Nicki Guadalajara, MD or one of the following Advanced Practice Providers on your designated Care Team: [place], [place] . Micah Flesher, PA-C  Any Other Special Instructions Will Be Listed Below (If Applicable). NONE

## 2018-05-02 LAB — BASIC METABOLIC PANEL
BUN / CREAT RATIO: 30 — AB (ref 12–28)
BUN: 33 mg/dL (ref 10–36)
CHLORIDE: 107 mmol/L — AB (ref 96–106)
CO2: 27 mmol/L (ref 20–29)
CREATININE: 1.1 mg/dL — AB (ref 0.57–1.00)
Calcium: 9.6 mg/dL (ref 8.7–10.3)
GFR calc Af Amer: 50 mL/min/{1.73_m2} — ABNORMAL LOW (ref >59)
GFR calc non Af Amer: 43 mL/min/{1.73_m2} — ABNORMAL LOW (ref >59)
GLUCOSE: 142 mg/dL — AB (ref 65–99)
POTASSIUM: 3.9 mmol/L (ref 3.5–5.2)
SODIUM: 150 mmol/L — AB (ref 134–144)

## 2018-05-14 ENCOUNTER — Ambulatory Visit (INDEPENDENT_AMBULATORY_CARE_PROVIDER_SITE_OTHER): Payer: Medicare Other | Admitting: *Deleted

## 2018-05-14 ENCOUNTER — Telehealth: Payer: Self-pay

## 2018-05-14 DIAGNOSIS — I10 Essential (primary) hypertension: Secondary | ICD-10-CM

## 2018-05-14 DIAGNOSIS — I442 Atrioventricular block, complete: Secondary | ICD-10-CM

## 2018-05-14 NOTE — Telephone Encounter (Signed)
Confirmed remote transmission w/ pt niece Jenna Luo.

## 2018-05-15 NOTE — Progress Notes (Signed)
Remote pacemaker transmission.   

## 2018-05-22 ENCOUNTER — Encounter: Payer: Self-pay | Admitting: Cardiology

## 2018-05-28 ENCOUNTER — Ambulatory Visit: Payer: Medicare Other | Admitting: Cardiovascular Disease

## 2018-05-28 ENCOUNTER — Encounter: Payer: Self-pay | Admitting: Cardiovascular Disease

## 2018-05-28 VITALS — BP 142/78 | HR 68 | Ht 61.0 in | Wt 140.2 lb

## 2018-05-28 DIAGNOSIS — I442 Atrioventricular block, complete: Secondary | ICD-10-CM

## 2018-05-28 DIAGNOSIS — I10 Essential (primary) hypertension: Secondary | ICD-10-CM

## 2018-05-28 DIAGNOSIS — E7800 Pure hypercholesterolemia, unspecified: Secondary | ICD-10-CM

## 2018-05-28 DIAGNOSIS — Z950 Presence of cardiac pacemaker: Secondary | ICD-10-CM | POA: Diagnosis not present

## 2018-05-28 NOTE — Progress Notes (Signed)
Patient ID: Krista Ray, female   DOB: [birth year], 84 y.o.   MRN: [id number]    Cardiology Office Note    Date:  05/29/2018   ID:  Krista Ray, DOB [birth year], MRN [id number]  PCP:  Laurann MontanaWhite, Cynthia, MD  Cardiologist:  Nicki Guadalajarahomas Kelly, MD; Thurmon FairMihai Sunset Joshi, MD   No chief complaint on file.   History of Present Illness:  Krista Ray is a 84 y.o. female with paroxysmal complete heart block and a dual-chamber permanent pacemaker returns for follow-up.   The patient specifically denies any chest pain at rest exertion, dyspnea at rest or with exertion, orthopnea, paroxysmal nocturnal dyspnea, syncope, palpitations, focal neurological deficits, intermittent claudication, lower extremity edema, unexplained weight gain, cough, hemoptysis or wheezing.  She has not had any recent falls.  Her next visit with Dr. Tresa EndoKelly in March.  She has a Administrator, artsaint Jude dual-chamber Accent device implanted in 2011 but still promises about 6 years of battery longevity.  Over the years he has had steadily worsening AV block and she now appears to have complete heart block and is pacemaker dependent.  There was no underlying escape rhythm not to pacing down to 30 bpm.  She has 100% ventricular pacing and also has 27% atrial pacing.  She had one brief episode of paroxysmal atrial tachycardia in March, but no atrial fibrillation no high ventricular rates.  Lead parameters remain excellent.   Past Medical History:  Diagnosis Date  . Chronic back pain   . Chronic neck pain   . Coronary artery disease   . DDD (degenerative disc disease), cervical   . DDD (degenerative disc disease), lumbar   . Hyperlipemia   . Hypertension   . MI (myocardial infarction) (HCC)   . Presence of permanent cardiac pacemaker 07/17/2009   St.Jude  . Radiculopathy   . Sciatic nerve pain     Past Surgical History:  Procedure Laterality Date  . ABDOMINAL HYSTERECTOMY  1956  . APPENDECTOMY  1944  . BACK SURGERY  2004  . CATARACT  EXTRACTION Right   . NM MYOCAR PERF WALL MOTION  09/06/2009   Normal  . PACEMAKER INSERTION  07/17/2009   left side-St.Jude  . US ECHOCARDIOGRAPHY  11/28/2011   Normal    Current Medications: Outpatient Medications Prior to Visit  Medication Sig Dispense Refill  . aspirin 81 MG tablet Take 1 tablet by mouth daily.    Marland Kitchen. ezetimibe (ZETIA) 10 MG tablet Take 1 tablet (10 mg total) by mouth daily. 90 tablet 3  . hydrochlorothiazide (HYDRODIURIL) 25 MG tablet Take 1 tablet (25 mg total) by mouth daily. 90 tablet 1  . latanoprost (XALATAN) 0.005 % ophthalmic solution Place 1 drop into both eyes as needed.   0  . lisinopril (PRINIVIL,ZESTRIL) 2.5 MG tablet TAKE 1 TABLET BY MOUTH EVERY DAY 90 tablet 4  . metoprolol succinate (TOPROL-XL) 25 MG 24 hr tablet take 1 tablet by mouth once daily 90 tablet 0   No facility-administered medications prior to visit.      Allergies:   Lovastatin; Penicillins; and Pravastatin sodium   Social History   Socioeconomic History  . Marital status: Widowed    Spouse name: Not on file  . Number of children: Not on file  . Years of education: Not on file  . Highest education level: Not on file  Occupational History  . Not on file  Social Needs  . Financial resource strain: Not on file  . Food insecurity:    Worry:  Not on file    Inability: Not on file  . Transportation needs:    Medical: Not on file    Non-medical: Not on file  Tobacco Use  . Smoking status: Never Smoker  . Smokeless tobacco: Never Used  Substance and Sexual Activity  . Alcohol use: No  . Drug use: No  . Sexual activity: Never  Lifestyle  . Physical activity:    Days per week: Not on file    Minutes per session: Not on file  . Stress: Not on file  Relationships  . Social connections:    Talks on phone: Not on file    Gets together: Not on file    Attends religious service: Not on file    Active member of club or organization: Not on file    Attends meetings of clubs or  organizations: Not on file    Relationship status: Not on file  Other Topics Concern  . Not on file  Social History Narrative  . Not on file     Family History:  The patient's family history includes Cancer in her brother; Heart disease in her father and sister; Hypertension in her father; Stroke in her father.   ROS:   Please see the history of present illness.    ROS all the systems are reviewed and are negative    PHYSICAL EXAM:   VS:  BP (!) 142/78   Pulse 68   Ht 5\' 1"  (1.549 m)   Wt 140 lb 3.2 oz (63.6 kg)   BMI 26.49 kg/m      General: Alert, oriented x3, no distress, appears a little more confused than last time I saw her, well-healed left subclavian pacemaker Head: no evidence of trauma, PERRL, EOMI, no exophtalmos or lid lag, no myxedema, no xanthelasma; normal ears, nose and oropharynx Neck: normal jugular venous pulsations and no hepatojugular reflux; brisk carotid pulses without delay and no carotid bruits Chest: clear to auscultation, no signs of consolidation by percussion or palpation, normal fremitus, symmetrical and full respiratory excursions Cardiovascular: normal position and quality of the apical impulse, regular rhythm, normal first and second heart sounds, no murmurs, rubs or gallops Abdomen: no tenderness or distention, no masses by palpation, no abnormal pulsatility or arterial bruits, normal bowel sounds, no hepatosplenomegaly Extremities: no clubbing, cyanosis or edema; 2+ radial, ulnar and brachial pulses bilaterally; 2+ right femoral, posterior tibial and dorsalis pedis pulses; 2+ left femoral, posterior tibial and dorsalis pedis pulses; no subclavian or femoral bruits Neurological: grossly nonfocal Psych: Normal mood and affect   Wt Readings from Last 3 Encounters:  05/28/18 140 lb 3.2 oz (63.6 kg)  04/23/18 139 lb 12.8 oz (63.4 kg)  04/07/18 140 lb 6.4 oz (63.7 kg)      Studies/Labs Reviewed:   EKG:  EKG is not ordered today.  The April 23, 2018 ECG shows atrial ventricular sequential pacing.  Recent Labs: 05/01/2018: BUN 33; Creatinine, Ser 1.10; Potassium 3.9; Sodium 150   Lipid Panel    Component Value Date/Time   CHOL 234 (H) 12/23/2016 0815   TRIG 93 12/23/2016 0815   HDL 73 12/23/2016 0815   CHOLHDL 3.2 12/23/2016 0815   CHOLHDL 3.1 08/15/2016 0956   VLDL 17 08/15/2016 0956   LDLCALC 142 (H) 12/23/2016 0815   LDLDIRECT 137.0 02/22/2013 1006     ASSESSMENT:    1. Complete heart block (HCC)   2. Pacemaker   3. Essential hypertension   4. Hypercholesterolemia  PLAN:  In order of problems listed above:  1. CHB: Pacemaker dependent today.  Pacing 2. PPM: Normal pacemaker function. Continue remote downloads every 3 months and office visit yearly 3. HTN: Fair, if not perfect, control.  No changes made to medication. 4. Hyperlipidemia: intolerant to statins .     Medication Adjustments/Labs and Tests Ordered: Current medicines are reviewed at length with the patient today.  Concerns regarding medicines are outlined above.  Medication changes, Labs and Tests ordered today are listed in the Patient Instructions below. Patient Instructions  Medication Instructions:  Dr Royann Shivers recommends that you continue on your current medications as directed. Please refer to the Current Medication list given to you today.  If you need a refill on your cardiac medications before your next appointment, please call your pharmacy.   Testing/Procedures: Remote monitoring is used to monitor your Pacemaker of ICD from home. This monitoring reduces the number of office visits required to check your device to one time per year. It allows [place] to keep an eye on the functioning of your device to ensure it is working properly. You are scheduled for a device check from home on Thursday, January 30th, 2020. You may send your transmission at any time that day. If you have a wireless device, the transmission will be sent  automatically. After your physician reviews your transmission, you will receive a postcard with your next transmission date.  Follow-Up: At [facility], you and your health needs are our priority.  As part of our continuing mission to provide you with exceptional heart care, we have created designated Provider Care Teams.  These Care Teams include your primary Cardiologist (physician) and Advanced Practice Providers (APPs -  Physician Assistants and Nurse Practitioners) who all work together to provide you with the care you need, when you need it. You will need a follow up appointment in 12 months.  Please call our office 2 months in advance to schedule this appointment.  You may see Thurmon Fair, MD or one of the following Advanced Practice Providers on your designated Care Team: [place], [place] . Micah Flesher, PA-C    Signed, Thurmon Fair, MD  05/29/2018 3:34 PM    [facility] [address] [place], [place], [place]  [address] Phone: [phone]; Fax: [phone]

## 2018-05-28 NOTE — Patient Instructions (Signed)
Medication Instructions:  Dr Royann Shiversroitoru recommends that you continue on your current medications as directed. Please refer to the Current Medication list given to you today.  If you need a refill on your cardiac medications before your next appointment, please call your pharmacy.   Testing/Procedures: Remote monitoring is used to monitor your Pacemaker of ICD from home. This monitoring reduces the number of office visits required to check your device to one time per year. It allows us to keep an eye on the functioning of your device to ensure it is working properly. You are scheduled for a device check from home on Thursday, January 30th, 2020. You may send your transmission at any time that day. If you have a wireless device, the transmission will be sent automatically. After your physician reviews your transmission, you will receive a postcard with your next transmission date.  Follow-Up: At [facility], you and your health needs are our priority.  As part of our continuing mission to provide you with exceptional heart care, we have created designated Provider Care Teams.  These Care Teams include your primary Cardiologist (physician) and Advanced Practice Providers (APPs -  Physician Assistants and Nurse Practitioners) who all work together to provide you with the care you need, when you need it. You will need a follow up appointment in 12 months.  Please call our office 2 months in advance to schedule this appointment.  You may see Thurmon FairMihai Croitoru, MD or one of the following Advanced Practice Providers on your designated Care Team: [place], [place] . Micah FlesherAngela Duke, PA-C

## 2018-05-29 LAB — CUP PACEART INCLINIC DEVICE CHECK
Implantable Lead Implant Date: 20110103
Implantable Lead Location: 753860
Implantable Pulse Generator Implant Date: 20110103
Lead Channel Setting Pacing Amplitude: 0.875
Lead Channel Setting Sensing Sensitivity: 2.5 mV
MDC IDC LEAD IMPLANT DT: 20110103
MDC IDC LEAD LOCATION: 753859
MDC IDC PG SERIAL: 7098440
MDC IDC SESS DTM: 20191115160620
MDC IDC SET LEADCHNL RA PACING AMPLITUDE: 1.5 V
MDC IDC SET LEADCHNL RV PACING PULSEWIDTH: 0.4 ms

## 2018-06-09 ENCOUNTER — Other Ambulatory Visit: Payer: Self-pay | Admitting: Cardiovascular Disease

## 2018-06-09 NOTE — Telephone Encounter (Signed)
Rx request sent to pharmacy.  

## 2018-07-14 LAB — CUP PACEART REMOTE DEVICE CHECK
Battery Remaining Longevity: 64 mo
Brady Statistic AP VS Percent: 1 %
Brady Statistic AS VP Percent: 71 %
Brady Statistic AS VS Percent: 1 %
Implantable Lead Location: 753860
Lead Channel Impedance Value: 380 Ohm
Lead Channel Pacing Threshold Amplitude: 0.625 V
Lead Channel Pacing Threshold Pulse Width: 0.4 ms
Lead Channel Pacing Threshold Pulse Width: 0.4 ms
Lead Channel Sensing Intrinsic Amplitude: 11.3 mV
Lead Channel Setting Pacing Amplitude: 1.5 V
MDC IDC LEAD IMPLANT DT: 20110103
MDC IDC LEAD IMPLANT DT: 20110103
MDC IDC LEAD LOCATION: 753859
MDC IDC MSMT BATTERY REMAINING PERCENTAGE: 46 %
MDC IDC MSMT BATTERY VOLTAGE: 2.86 V
MDC IDC MSMT LEADCHNL RA PACING THRESHOLD AMPLITUDE: 0.5 V
MDC IDC MSMT LEADCHNL RA SENSING INTR AMPL: 1.6 mV
MDC IDC MSMT LEADCHNL RV IMPEDANCE VALUE: 560 Ohm
MDC IDC PG IMPLANT DT: 20110103
MDC IDC SESS DTM: 20191031183520
MDC IDC SET LEADCHNL RV PACING AMPLITUDE: 0.875
MDC IDC SET LEADCHNL RV PACING PULSEWIDTH: 0.4 ms
MDC IDC SET LEADCHNL RV SENSING SENSITIVITY: 2.5 mV
MDC IDC STAT BRADY AP VP PERCENT: 29 %
MDC IDC STAT BRADY RA PERCENT PACED: 27 %
MDC IDC STAT BRADY RV PERCENT PACED: 99 %
Pulse Gen Model: 2110
Pulse Gen Serial Number: 7098440

## 2018-08-13 ENCOUNTER — Ambulatory Visit (INDEPENDENT_AMBULATORY_CARE_PROVIDER_SITE_OTHER): Payer: Medicare Other

## 2018-08-13 DIAGNOSIS — I442 Atrioventricular block, complete: Secondary | ICD-10-CM

## 2018-08-14 LAB — CUP PACEART REMOTE DEVICE CHECK
Battery Remaining Percentage: 46 %
Battery Voltage: 2.86 V
Brady Statistic AS VS Percent: 1 %
Brady Statistic RA Percent Paced: 29 %
Brady Statistic RV Percent Paced: 99 %
Date Time Interrogation Session: 20200130150113
Implantable Lead Implant Date: 20110103
Implantable Lead Implant Date: 20110103
Implantable Lead Location: 753859
Implantable Pulse Generator Implant Date: 20110103
Lead Channel Impedance Value: 540 Ohm
Lead Channel Pacing Threshold Amplitude: 0.5 V
Lead Channel Sensing Intrinsic Amplitude: 1.9 mV
Lead Channel Setting Sensing Sensitivity: 2.5 mV
MDC IDC LEAD LOCATION: 753860
MDC IDC MSMT BATTERY REMAINING LONGEVITY: 65 mo
MDC IDC MSMT LEADCHNL RA IMPEDANCE VALUE: 430 Ohm
MDC IDC MSMT LEADCHNL RA PACING THRESHOLD PULSEWIDTH: 0.4 ms
MDC IDC MSMT LEADCHNL RV PACING THRESHOLD AMPLITUDE: 0.625 V
MDC IDC MSMT LEADCHNL RV PACING THRESHOLD PULSEWIDTH: 0.4 ms
MDC IDC MSMT LEADCHNL RV SENSING INTR AMPL: 11 mV
MDC IDC SET LEADCHNL RA PACING AMPLITUDE: 1.5 V
MDC IDC SET LEADCHNL RV PACING AMPLITUDE: 0.875
MDC IDC SET LEADCHNL RV PACING PULSEWIDTH: 0.4 ms
MDC IDC STAT BRADY AP VP PERCENT: 30 %
MDC IDC STAT BRADY AP VS PERCENT: 1 %
MDC IDC STAT BRADY AS VP PERCENT: 69 %
Pulse Gen Model: 2110
Pulse Gen Serial Number: 7098440

## 2018-08-20 ENCOUNTER — Other Ambulatory Visit: Payer: Self-pay | Admitting: Cardiovascular Disease

## 2018-08-20 NOTE — Telephone Encounter (Signed)
New message     *STAT* If patient is at the pharmacy, call can be transferred to refill team.   1. Which medications need to be refilled? (please list name of each medication and dose if known) hydrochlorothiazide (MICROZIDE) 12.5 MG capsule  2. Which pharmacy/location (including street and city if local pharmacy) is medication to be sent to?[id number] [place], [place] - [address]  3. Do they need a 30 day or 90 day supply? 30   Pt spouse stated that he just picked up medication from pharmacy and lost it . No more refills avail

## 2018-08-21 ENCOUNTER — Other Ambulatory Visit: Payer: Self-pay | Admitting: Cardiovascular Disease

## 2018-08-21 ENCOUNTER — Encounter: Payer: Self-pay | Admitting: Cardiovascular Disease

## 2018-08-21 NOTE — Telephone Encounter (Signed)
°*  STAT* If patient is at the pharmacy, call can be transferred to refill team.   1. Which medications need to be refilled? (please list name of each medication and dose if known)Please call today- pt is out of her medicine- Hydrochlorothiazide  2. Which pharmacy/location (including street and city if local pharmacy) is medication to be sent to?[facility] [address], [place]  3. Do they need a 30 day or 90 day supply? 30 and refils

## 2018-08-21 NOTE — Telephone Encounter (Signed)
Lm to call back to let pt know Hctz refill sent as requested ./cy

## 2018-08-21 NOTE — Telephone Encounter (Signed)
New patient    Patient's niece is returning your call.

## 2018-08-21 NOTE — Progress Notes (Signed)
Remote pacemaker transmission.   

## 2018-08-21 NOTE — Telephone Encounter (Signed)
This encounter was created in error - please disregard.

## 2018-08-21 NOTE — Telephone Encounter (Signed)
F/U Message        Patient is calling to check status on lost refill

## 2018-08-24 ENCOUNTER — Encounter: Payer: Self-pay | Admitting: Cardiology

## 2018-09-11 ENCOUNTER — Other Ambulatory Visit: Payer: Self-pay | Admitting: Cardiovascular Disease

## 2018-10-06 ENCOUNTER — Telehealth: Payer: Self-pay

## 2018-10-06 ENCOUNTER — Other Ambulatory Visit: Payer: Self-pay | Admitting: *Deleted

## 2018-10-06 NOTE — Telephone Encounter (Signed)
   Cardiac Questionnaire:    Since your last visit or hospitalization:    1. Have you been having new or worsening chest pain? No   2. Have you been having new or worsening shortness of breath? No 3. Have you been having new or worsening leg swelling, wt gain, or increase in abdominal girth (pants fitting more tightly)? No   4. Have you had any passing out spells? No     Primary Cardiologist:  Nicki Guadalajara, MD   Patient contacted.  History reviewed.  No symptoms to suggest any unstable cardiac conditions.  Based on discussion, with current pandemic situation, we will be postponing this appointment for Krista Ray with a plan for f/u in 12 wks or sooner if feasible/necessary.  If symptoms change, she has been instructed to contact our office.   Routing to C19 CANCEL pool for tracking (P CV DIV CV19 CANCEL - reason for visit "other.") and assigning priority (1 = 4-6 wks, 2 = 6-12 wks, 3 = >12 wks).   Priority 3  Cydney Ok, LPN  2/29/7989 2:11 AM         .

## 2018-10-07 ENCOUNTER — Other Ambulatory Visit: Payer: Self-pay | Admitting: Cardiovascular Disease

## 2018-10-07 ENCOUNTER — Ambulatory Visit: Payer: Medicare Other | Admitting: Cardiovascular Disease

## 2018-10-07 MED ORDER — METOPROLOL SUCCINATE ER 25 MG PO TB24: 25.0000 mg | ORAL_TABLET | Freq: Every day | ORAL | 0 refills | Status: DC

## 2018-10-07 MED ORDER — HYDROCHLOROTHIAZIDE 25 MG PO TABS: 25.0000 mg | ORAL_TABLET | Freq: Every day | ORAL | 3 refills | Status: AC

## 2018-10-07 NOTE — Telephone Encounter (Signed)
°*  STAT* If patient is at the pharmacy, call can be transferred to refill team.   1. Which medications need to be refilled? (please list name of each medication and dose if known) metoprolol 25 mg  2. Which pharmacy/location (including street and city if local pharmacy) is [facility] to be sent to[facility] [address] [phone]  3. Do they need a 30 day or 90 day supply?  90 patient was reshcdule for today per Dr. Patient cancel for vitus reasons.

## 2018-10-07 NOTE — Telephone Encounter (Signed)
RX sent in for Metoprolol.  

## 2018-11-12 ENCOUNTER — Other Ambulatory Visit: Payer: Self-pay

## 2018-11-12 ENCOUNTER — Encounter: Payer: Medicare Other | Admitting: *Deleted

## 2018-11-27 ENCOUNTER — Telehealth: Payer: Self-pay

## 2018-11-27 NOTE — Telephone Encounter (Signed)
Spoke to pt. Rescheduled her appointment to see Joni Reining, DNP on 12/01/18 at 11AM. Pt gave verbal consent for telephone visit. Pt was originally rescheduled to 7/21 with Dr. Tresa Endo, and was agreeable to sooner appt.

## 2018-11-29 NOTE — Progress Notes (Signed)
Virtual Visit via Telephone Note   This visit type was conducted due to national recommendations for restrictions regarding the COVID-19 Pandemic (e.g. social distancing) in an effort to limit this patient's exposure and mitigate transmission in our community.  Due to her co-morbid illnesses, this patient is at least at moderate risk for complications without adequate follow up.  This format is felt to be most appropriate for this patient at this time.  The patient did not have access to video technology/had technical difficulties with video requiring transitioning to audio format only (telephone).  All issues noted in this document were discussed and addressed.  No physical exam could be performed with this format.  Please refer to the patient's chart for her  consent to telehealth for [facility].   Date:  12/01/2018   ID:  Krista Ray, DOB 03/09/1923, MRN [id number]  Patient Location: Home Provider Location: Home  PCP:  Krista Montana, MD  Cardiologist:  Krista Guadalajara, MD  Electrophysiologist:  None   Evaluation Performed:  Follow-Up Visit  Chief Complaint:  Follow up  History of Present Illness:    Krista Ray is a 85 y.o. female we are seen today via telehealth/telemedicine visit for ongoing assessment and management of paroxysmal complete heart block status post dual-chamber permanent pacemaker (St. Jude Accent device implanted 2011), other history includes chronic diastolic heart failure, hypertension, pure hypercholesterolemia.  She was last seen for general cardiology visit on 04/23/2018 by Krista Flesher, PA, and was stable from a cardiac standpoint.  She was asymptomatic with a dry weight of 140 pounds.  No medication changes were made.  She is doing very well today. She is cared for by her cousin and her niece who is with her on the phone today. She offers no complaints of chest pain, rapid or irregular HR, DOE. She remains active and misses social activities related  to COVID 19 restrictions of social distancing. She has no issues with her medications.   The patient does not have symptoms concerning for COVID-19 infection (fever, chills, cough, or new shortness of breath).    Past Medical History:  Diagnosis Date   Chronic back pain    Chronic neck pain    Coronary artery disease    DDD (degenerative disc disease), cervical    DDD (degenerative disc disease), lumbar    Hyperlipemia    Hypertension    MI (myocardial infarction) (HCC)    Presence of permanent cardiac pacemaker 07/17/2009   St.Jude   Radiculopathy    Sciatic nerve pain    Past Surgical History:  Procedure Laterality Date   ABDOMINAL HYSTERECTOMY  1956   APPENDECTOMY  1944   BACK SURGERY  2004   CATARACT EXTRACTION Right    NM MYOCAR PERF WALL MOTION  09/06/2009   Normal   PACEMAKER INSERTION  07/17/2009   left side-St.Jude   US ECHOCARDIOGRAPHY  11/28/2011   Normal     Current Meds  Medication Sig   aspirin 81 MG tablet Take 1 tablet by mouth daily.   ezetimibe (ZETIA) 10 MG tablet TAKE 1 TABLET BY MOUTH EVERY DAY   hydrochlorothiazide (HYDRODIURIL) 25 MG tablet Take 1 tablet (25 mg total) by mouth daily.   hydrochlorothiazide (MICROZIDE) 12.5 MG capsule ALTERNATE TAKING 25MG  AND 12.5 MG EVERY OTHER DAY.   latanoprost (XALATAN) 0.005 % ophthalmic solution Place 1 drop into both eyes as needed.    lisinopril (PRINIVIL,ZESTRIL) 2.5 MG tablet TAKE 1 TABLET BY MOUTH EVERY DAY  metoprolol succinate (TOPROL-XL) 25 MG 24 hr tablet Take 1 tablet (25 mg total) by mouth daily.     Allergies:   Lovastatin; Penicillins; and Pravastatin sodium   Social History   Tobacco Use   Smoking status: Never Smoker   Smokeless tobacco: Never Used  Substance Use Topics   Alcohol use: No   Drug use: No     Family Hx: The patient's family history includes Cancer in her brother; Heart disease in her father and sister; Hypertension in her father; Stroke in her  father. There is no history of Diabetes.  ROS:   Please see the history of present illness.    All other systems reviewed and are negative.   Prior CV studies:   The following studies were reviewed today:  Echocardiogram 02/19/2016 Left ventricle: The cavity size was normal. Systolic function was   normal. The estimated ejection fraction was in the range of 60%   to 65%. Wall motion was normal; there were no regional wall   motion abnormalities. Doppler parameters are consistent with   abnormal left ventricular relaxation (grade 1 diastolic   dysfunction). Doppler parameters are consistent with high   ventricular filling pressure. - Aortic valve: Transvalvular velocity was within the normal range.   There was no stenosis. There was no regurgitation. - Mitral valve: There was trivial regurgitation. - Right ventricle: The cavity size was normal. Wall thickness was   normal. Systolic function was normal. - Tricuspid valve: There was trivial regurgitation. - Pulmonary arteries: Systolic pressure was within the normal   range. PA peak pressure: 25 mm Hg (S).  Pacemaker Interrogation: 08/13/2018  Remote reviewed.  Complete heart block: pacemaker dependent. Battery status is good. Lead measurements are stable. Heart rate histogram is favorable. No clinically significant episodes of high ventricular rate or atrial mode switch noted.   Labs/Other Tests and Data Reviewed:    EKG:    Recent Labs: 05/01/2018: BUN 33; Creatinine, Ser 1.10; Potassium 3.9; Sodium 150   Recent Lipid Panel Lab Results  Component Value Date/Time   CHOL 234 (H) 12/23/2016 08:15 AM   TRIG 93 12/23/2016 08:15 AM   HDL 73 12/23/2016 08:15 AM   CHOLHDL 3.2 12/23/2016 08:15 AM   CHOLHDL 3.1 08/15/2016 09:56 AM   LDLCALC 142 (H) 12/23/2016 08:15 AM   LDLDIRECT 137.0 02/22/2013 10:06 AM    Wt Readings from Last 3 Encounters:  12/01/18 140 lb (63.5 kg)  05/28/18 140 lb 3.2 oz (63.6 kg)  04/23/18 139 lb  12.8 oz (63.4 kg)     Objective:    Vital Signs:  Wt 140 lb (63.5 kg)    BMI 26.45 kg/m    VITAL SIGNS:  reviewed GEN:  no acute distress RESPIRATORY:  normal respiratory effort, symmetric expansion NEURO:  alert and oriented x 3, no obvious focal deficit PSYCH:  normal affect  ASSESSMENT & PLAN:    1. Chronic Diastolic CHF: She denies symptoms of LEE, abdominal distention or DOE. She is maintaining her weight. She is medically compliant. No changes in her regimen. She is seeing PCP on the 29th of May for labs.   2. Hypertension: Her niece read her recorded BP to me. Ranging from 132/69 at the highest to 122/62.  Averages 124/64. She denies dizziness or fatigue. Remains active.  3.   CAD: Continue medical management.   4.   PPM in situ: St. Jude Merlin last interrogated 2 weeks ago. Continue remote  interrogations per protocol.    COVID-19 Education: The signs and symptoms of COVID-19 were discussed with the patient and how to seek care for testing (follow up with PCP or arrange E-visit).  The importance of social distancing was discussed today.  Time:   Today, I have spent 10 minutes with the patient with telehealth technology discussing the above problems.     Medication Adjustments/Labs and Tests Ordered: Current medicines are reviewed at length with the patient today.  Concerns regarding medicines are outlined above.   Tests Ordered: No orders of the defined types were placed in this encounter.   Medication Changes: No orders of the defined types were placed in this encounter.   Disposition:  Follow up 6 months   Signed, Bettey MareKathryn M. Liborio NixonLawrence DNP, ANP, AACC  12/01/2018 11:28 AM    [facility]

## 2018-11-30 ENCOUNTER — Telehealth: Payer: Self-pay | Admitting: Adult Health

## 2018-12-01 ENCOUNTER — Telehealth (INDEPENDENT_AMBULATORY_CARE_PROVIDER_SITE_OTHER): Payer: Medicare Other | Admitting: Adult Health

## 2018-12-01 ENCOUNTER — Telehealth: Payer: Self-pay | Admitting: Cardiology

## 2018-12-01 ENCOUNTER — Encounter: Payer: Self-pay | Admitting: Adult Health

## 2018-12-01 VITALS — Wt 140.0 lb

## 2018-12-01 DIAGNOSIS — I10 Essential (primary) hypertension: Secondary | ICD-10-CM

## 2018-12-01 DIAGNOSIS — I442 Atrioventricular block, complete: Secondary | ICD-10-CM

## 2018-12-01 DIAGNOSIS — I2510 Atherosclerotic heart disease of native coronary artery without angina pectoris: Secondary | ICD-10-CM

## 2018-12-01 DIAGNOSIS — I5032 Chronic diastolic (congestive) heart failure: Secondary | ICD-10-CM | POA: Diagnosis not present

## 2018-12-01 DIAGNOSIS — Z950 Presence of cardiac pacemaker: Secondary | ICD-10-CM

## 2018-12-01 NOTE — Telephone Encounter (Signed)
Follow Up:    Pt received  A letter stating that you did not receive her last transmission. Ptease call, pt said she did send transmission. hN

## 2018-12-01 NOTE — Patient Instructions (Signed)
Medication Instructions:  Your physician recommends that you continue on your current medications as directed. Please refer to the Current Medication list given to you today.  If you need a refill on your cardiac medications before your next appointment, please call your pharmacy.   Follow-Up: At [facility], you and your health needs are our priority.  As part of our continuing mission to provide you with exceptional heart care, we have created designated Provider Care Teams.  These Care Teams include your primary Cardiologist (physician) and Advanced Practice Providers (APPs -  Physician Assistants and Nurse Practitioners) who all work together to provide you with the care you need, when you need it. You will need a follow up appointment in 6 months.  Please call our office 2 months in advance to schedule this appointment.  You may see Thomas Kelly, MD or one of the following Advanced Practice Providers on your designated Care Team: Hao Meng, PA-C . Angela Duke, PA-C  Any Other Special Instructions Will Be Listed Below (If Applicable). None   

## 2018-12-29 ENCOUNTER — Other Ambulatory Visit: Payer: Self-pay | Admitting: Cardiovascular Disease

## 2019-01-11 NOTE — Telephone Encounter (Signed)
Opened in error

## 2019-02-02 ENCOUNTER — Telehealth: Payer: Medicare Other | Admitting: Cardiovascular Disease

## 2019-07-02 ENCOUNTER — Other Ambulatory Visit: Payer: Self-pay | Admitting: Cardiovascular Disease

## 2019-08-20 ENCOUNTER — Telehealth: Payer: Self-pay | Admitting: Hospice

## 2019-08-20 NOTE — Telephone Encounter (Signed)
[facility] Palliative vist scheduled for 09-01-19 at 12:00.

## 2019-09-01 ENCOUNTER — Other Ambulatory Visit: Payer: Self-pay

## 2019-09-01 ENCOUNTER — Other Ambulatory Visit: Payer: Medicare PPO | Admitting: Hospice

## 2019-09-01 DIAGNOSIS — G308 Other Alzheimer's disease: Secondary | ICD-10-CM

## 2019-09-01 DIAGNOSIS — F0281 Dementia in other diseases classified elsewhere with behavioral disturbance: Secondary | ICD-10-CM

## 2019-09-01 DIAGNOSIS — Z515 Encounter for palliative care: Secondary | ICD-10-CM

## 2019-09-01 NOTE — Progress Notes (Signed)
Therapist, nutritional Palliative Care Consult Note Telephone: [phone]  Fax: [phone]  PATIENT NAME: Krista Ray DOB: 1923/03/23 MRN: [id number]  PRIMARY CARE PROVIDER: Dr Tyson Dense REFERRING PROVIDER:  Dr Tyson Dense RESPONSIBLE PARTY:   Niece -  Doris  Caregiver - [name] [phone]  TELEHEALTH VISIT STATEMENT Due to the COVID-19 crisis, this visit was done via telephone from my office. It was initiated and consented to by this patient and/or family.  RECOMMENDATIONS/PLAN:   Advance Care Planning/Goals of Care: Telehealth Visit consisted of building trust and discussions on Palliative Medicine as specialized medical care for people living with serious illness, aimed at facilitating better quality of life through symptoms relief, assisting with advance care plan and establishing goals of care. Spoke with Krista Ray who was not home during telehealth visit. Doris affirmed patient is a full code. Goals of care include to maximize quality of life and symptom management. Symptom management: Ongoing memory loss and agitation related to Dementia. FAST 6d. Agitation is currently managed with Deparkote sprinkles 125mg  in the morning and 250mg  along Tylenol PM at night. Hazel expressed concerned that in the past month, patient is worsening in her Dementia, now mostly in bed and not walking around as before and often agitated during the day.  Patient has virtual visit with PCP this Friday 09/03/2019. PCP may consider increasing morning Deparkote dose to 250mg .  She is now max assist to stand and take a few steps. Patient has a dual chamber permanent pacemaker for paroxysmal complete heart block.  Patient denies pain/discomfort. No report of edema, chest pain, SOB. Patient is compliant with her medications. Her appetite is fair/good. Encouraged ongoing care. Follow up: Palliative care will continue to follow patient for goals of care clarification and symptom  management. I spent 76 minutes providing this consultation; time includes chart review and documentation. More than 50% of the time in this consultation was spent on coordinating communication. History of present illness: Patient is a 86 year old female with history of Parxysmal complete heart block, presence of permanent pacemaker, HTN, CAD, Alzheimer's Dementia. Palliative care was asked to follow for goals of care clarification and symptoms management.   CODE STATUS: Full   PPS: weak 40% HOSPICE ELIGIBILITY/DIAGNOSIS: TBD  PAST MEDICAL HISTORY:  Past Medical History:  Diagnosis Date  . Chronic back pain   . Chronic neck pain   . Coronary artery disease   . DDD (degenerative disc disease), cervical   . DDD (degenerative disc disease), lumbar   . Hyperlipemia   . Hypertension   . MI (myocardial infarction) (HCC)   . Presence of permanent cardiac pacemaker 07/17/2009   St.Jude  . Radiculopathy   . Sciatic nerve pain     SOCIAL HX:  Social History   Tobacco Use  . Smoking status: Never Smoker  . Smokeless tobacco: Never Used  Substance Use Topics  . Alcohol use: No    ALLERGIES:  Allergies  Allergen Reactions  . Lovastatin Nausea And Vomiting  . Penicillins Other (See Comments)    Unknown been years ago  . Pravastatin Sodium Nausea And Vomiting     PERTINENT MEDICATIONS:  Outpatient Encounter Medications as of 09/01/2019  Medication Sig  . aspirin 81 MG tablet Take 1 tablet by mouth daily.  92 ezetimibe (ZETIA) 10 MG tablet TAKE 1 TABLET BY MOUTH EVERY DAY  . hydrochlorothiazide (HYDRODIURIL) 25 MG tablet Take 1 tablet (25 mg total) by mouth daily.  . hydrochlorothiazide (MICROZIDE)  12.5 MG capsule ALTERNATE TAKING 25MG  AND 12.5 MG EVERY OTHER DAY.  Marland Kitchen latanoprost (XALATAN) 0.005 % ophthalmic solution Place 1 drop into both eyes as needed.   Marland Kitchen lisinopril (ZESTRIL) 2.5 MG tablet TAKE 1 TABLET BY MOUTH EVERY DAY  . metoprolol succinate (TOPROL-XL) 25 MG 24 hr tablet TAKE 1  TABLET BY MOUTH EVERY DAY   No facility-administered encounter medications on file as of 09/01/2019.    Teodoro Spray, NP

## 2019-09-21 DIAGNOSIS — R531 Weakness: Secondary | ICD-10-CM | POA: Diagnosis not present

## 2019-09-21 DIAGNOSIS — G301 Alzheimer's disease with late onset: Secondary | ICD-10-CM | POA: Diagnosis not present

## 2019-09-21 DIAGNOSIS — L89101 Pressure ulcer of unspecified part of back, stage 1: Secondary | ICD-10-CM | POA: Diagnosis not present

## 2019-09-22 DIAGNOSIS — L89101 Pressure ulcer of unspecified part of back, stage 1: Secondary | ICD-10-CM | POA: Diagnosis not present

## 2019-09-22 DIAGNOSIS — G301 Alzheimer's disease with late onset: Secondary | ICD-10-CM | POA: Diagnosis not present

## 2019-09-22 DIAGNOSIS — E46 Unspecified protein-calorie malnutrition: Secondary | ICD-10-CM | POA: Diagnosis not present

## 2019-09-22 DIAGNOSIS — Z7401 Bed confinement status: Secondary | ICD-10-CM | POA: Diagnosis not present

## 2019-09-22 DIAGNOSIS — R531 Weakness: Secondary | ICD-10-CM | POA: Diagnosis not present

## 2019-09-23 ENCOUNTER — Other Ambulatory Visit: Payer: Self-pay

## 2019-09-23 ENCOUNTER — Other Ambulatory Visit: Payer: Medicare PPO | Admitting: Hospice

## 2019-09-23 DIAGNOSIS — Z515 Encounter for palliative care: Secondary | ICD-10-CM | POA: Diagnosis not present

## 2019-09-23 DIAGNOSIS — G308 Other Alzheimer's disease: Secondary | ICD-10-CM

## 2019-09-23 DIAGNOSIS — F0281 Dementia in other diseases classified elsewhere with behavioral disturbance: Secondary | ICD-10-CM

## 2019-09-23 NOTE — Progress Notes (Signed)
Therapist, nutritional Palliative Care Consult Note Telephone: [phone]  Fax: [phone]  PATIENT NAME: Krista Ray DOB: 1923-05-08 MRN: [id number]  PRIMARY CARE PROVIDER:   Laurann Montana, MD  REFERRING PROVIDER:  Laurann Montana, MD [phone] [address] [place],  [place] [address]  PRIMARY CARE PROVIDER: Dr Tyson Dense REFERRING PROVIDER:  Dr Tyson Dense RESPONSIBLE PARTY:   Niece [place] [phone] House no: [phone] Caregiver - [name] [phone]  TELEHEALTH VISIT STATEMENT Due to the COVID-19 crisis, this visit was done via telephone from my office. It was initiated and consented to by this patient and/or family.  RECOMMENDATIONS/PLAN:   Advance Care Planning/Goals of Care: Telehealth Visit consisted of building trust and follow up on palliative care.Patient is a full code. Goals of care include to maximize quality of life and symptom management. POA is open to in person visit for discussion on code status and goals of care scheduled 10/27/2019.  Symptom management: Ongoing memory loss and agitation related to Dementia. Incremental decline in functional status in line with Dementia disease trajectory. Patient in the last 6 weeks has declined from PPS 40% to 30%, bed bound; she is now FAST 7c from 6d. Agitation is currently managed with Deparkote sprinkles 125mg  in the morning and 250mg  along Tylenol PM at night. Trazadone recently added per daughter; effective.  Patient has a dual chamber permanent pacemaker for paroxysmal complete heart block.  No report of edema, chest pain, SOB. Patient is compliant with her medications. Her appetite is fair/good. Encouraged ongoing care. Follow up: Palliative care will continue to follow patient for goals of care clarification and symptom management. I spent 46 minutes providing this consultation; time includes chart review and documentation. More than 50% of the time in this  consultation was spent on coordinating communication. History of present illness: Patient is a 86 year old female with history of Parxysmal complete heart block, presence of permanent pacemaker, HTN, CAD, Alzheimer's Dementia. Palliative care was asked to follow for goals of care clarification and symptoms management.    CODE STATUS: Full  PPS: 30% HOSPICE ELIGIBILITY/DIAGNOSIS: TBD  PAST MEDICAL HISTORY:  Past Medical History:  Diagnosis Date  . Chronic back pain   . Chronic neck pain   . Coronary artery disease   . DDD (degenerative disc disease), cervical   . DDD (degenerative disc disease), lumbar   . Hyperlipemia   . Hypertension   . MI (myocardial infarction) (HCC)   . Presence of permanent cardiac pacemaker 07/17/2009   St.Jude  . Radiculopathy   . Sciatic nerve pain     SOCIAL HX:  Social History   Tobacco Use  . Smoking status: Never Smoker  . Smokeless tobacco: Never Used  Substance Use Topics  . Alcohol use: No    ALLERGIES:  Allergies  Allergen Reactions  . Lovastatin Nausea And Vomiting  . Penicillins Other (See Comments)    Unknown been years ago  . Pravastatin Sodium Nausea And Vomiting     PERTINENT MEDICATIONS:  Outpatient Encounter Medications as of 09/23/2019  Medication Sig  . aspirin 81 MG tablet Take 1 tablet by mouth daily.  09/14/2009 ezetimibe (ZETIA) 10 MG tablet TAKE 1 TABLET BY MOUTH EVERY DAY  . hydrochlorothiazide (HYDRODIURIL) 25 MG tablet Take 1 tablet (25 mg total) by mouth daily.  . hydrochlorothiazide (MICROZIDE) 12.5 MG capsule ALTERNATE TAKING 25MG  AND 12.5 MG EVERY OTHER DAY.  11/23/2019 latanoprost (XALATAN) 0.005 %  ophthalmic solution Place 1 drop into both eyes as needed.   Marland Kitchen lisinopril (ZESTRIL) 2.5 MG tablet TAKE 1 TABLET BY MOUTH EVERY DAY  . metoprolol succinate (TOPROL-XL) 25 MG 24 hr tablet TAKE 1 TABLET BY MOUTH EVERY DAY   No facility-administered encounter medications on file as of 09/23/2019.   Teodoro Spray, NP

## 2019-09-27 DIAGNOSIS — L89101 Pressure ulcer of unspecified part of back, stage 1: Secondary | ICD-10-CM | POA: Diagnosis not present

## 2019-09-27 DIAGNOSIS — F0391 Unspecified dementia with behavioral disturbance: Secondary | ICD-10-CM | POA: Diagnosis not present

## 2019-09-27 DIAGNOSIS — Z7401 Bed confinement status: Secondary | ICD-10-CM | POA: Diagnosis not present

## 2019-09-27 DIAGNOSIS — G301 Alzheimer's disease with late onset: Secondary | ICD-10-CM | POA: Diagnosis not present

## 2019-10-18 DIAGNOSIS — E46 Unspecified protein-calorie malnutrition: Secondary | ICD-10-CM | POA: Diagnosis not present

## 2019-10-18 DIAGNOSIS — G301 Alzheimer's disease with late onset: Secondary | ICD-10-CM | POA: Diagnosis not present

## 2019-10-18 DIAGNOSIS — N182 Chronic kidney disease, stage 2 (mild): Secondary | ICD-10-CM | POA: Diagnosis not present

## 2019-10-18 DIAGNOSIS — F0391 Unspecified dementia with behavioral disturbance: Secondary | ICD-10-CM | POA: Diagnosis not present

## 2019-10-18 DIAGNOSIS — I10 Essential (primary) hypertension: Secondary | ICD-10-CM | POA: Diagnosis not present

## 2019-10-22 DIAGNOSIS — L89101 Pressure ulcer of unspecified part of back, stage 1: Secondary | ICD-10-CM | POA: Diagnosis not present

## 2019-10-22 DIAGNOSIS — G301 Alzheimer's disease with late onset: Secondary | ICD-10-CM | POA: Diagnosis not present

## 2019-10-22 DIAGNOSIS — R531 Weakness: Secondary | ICD-10-CM | POA: Diagnosis not present

## 2019-10-27 ENCOUNTER — Other Ambulatory Visit: Payer: Medicare PPO | Admitting: Hospice

## 2019-10-27 ENCOUNTER — Other Ambulatory Visit: Payer: Self-pay

## 2019-10-27 DIAGNOSIS — F0281 Dementia in other diseases classified elsewhere with behavioral disturbance: Secondary | ICD-10-CM

## 2019-10-27 DIAGNOSIS — Z515 Encounter for palliative care: Secondary | ICD-10-CM

## 2019-10-27 DIAGNOSIS — G308 Other Alzheimer's disease: Secondary | ICD-10-CM

## 2019-10-27 NOTE — Progress Notes (Signed)
    Therapist, nutritional Palliative Care Consult Note Telephone: [phone]  Fax: [phone]  PATIENT NAME: Krista Ray DOB: [birth year] MRN: [id number]   Visit was stopped when family reported that patient was admitted to [facility] service 10/20/2019.

## 2019-12-14 DEATH — deceased

## 2019-12-22 ENCOUNTER — Other Ambulatory Visit: Payer: Self-pay | Admitting: Cardiovascular Disease
